# Patient Record
Sex: Female | Born: 1990 | Race: Black or African American | Hispanic: No | Marital: Single | Smoking: Never smoker
Health system: Southern US, Community
[De-identification: ages and names within clinical notes are randomized; demographics above are authoritative.]

## PROBLEM LIST (undated history)

## (undated) DIAGNOSIS — A749 Chlamydial infection, unspecified: Secondary | ICD-10-CM

## (undated) DIAGNOSIS — R896 Abnormal cytological findings in specimens from other organs, systems and tissues: Secondary | ICD-10-CM

## (undated) DIAGNOSIS — E0590 Thyrotoxicosis, unspecified without thyrotoxic crisis or storm: Secondary | ICD-10-CM

## (undated) DIAGNOSIS — IMO0002 Reserved for concepts with insufficient information to code with codable children: Secondary | ICD-10-CM

## (undated) DIAGNOSIS — E0500 Thyrotoxicosis with diffuse goiter without thyrotoxic crisis or storm: Secondary | ICD-10-CM

## (undated) HISTORY — DX: Chlamydial infection, unspecified: A74.9

## (undated) HISTORY — DX: Thyrotoxicosis with diffuse goiter without thyrotoxic crisis or storm: E05.00

## (undated) HISTORY — DX: Thyrotoxicosis, unspecified without thyrotoxic crisis or storm: E05.90

## (undated) HISTORY — DX: Reserved for concepts with insufficient information to code with codable children: IMO0002

## (undated) HISTORY — PX: NO PAST SURGERIES: SHX2092

## (undated) HISTORY — DX: Abnormal cytological findings in specimens from other organs, systems and tissues: R89.6

---
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
## 2010-05-26 ENCOUNTER — Emergency Department (HOSPITAL_COMMUNITY)
Admission: EM | Admit: 2010-05-26 | Discharge: 2010-05-26 | Disposition: A | Discharge status: Home / Self Care | Payer: Self-pay | Attending: Emergency Medicine | Admitting: Emergency Medicine

## 2010-05-26 ENCOUNTER — Emergency Department (HOSPITAL_COMMUNITY): Payer: Self-pay

## 2010-05-26 DIAGNOSIS — R112 Nausea with vomiting, unspecified: Secondary | ICD-10-CM | POA: Insufficient documentation

## 2010-05-26 DIAGNOSIS — N390 Urinary tract infection, site not specified: Secondary | ICD-10-CM | POA: Insufficient documentation

## 2010-05-26 DIAGNOSIS — R059 Cough, unspecified: Secondary | ICD-10-CM | POA: Insufficient documentation

## 2010-05-26 DIAGNOSIS — R05 Cough: Secondary | ICD-10-CM | POA: Insufficient documentation

## 2010-05-26 DIAGNOSIS — R61 Generalized hyperhidrosis: Secondary | ICD-10-CM | POA: Insufficient documentation

## 2010-05-26 DIAGNOSIS — R6883 Chills (without fever): Secondary | ICD-10-CM | POA: Insufficient documentation

## 2010-05-26 DIAGNOSIS — R000 Tachycardia, unspecified: Secondary | ICD-10-CM | POA: Insufficient documentation

## 2010-05-26 DIAGNOSIS — R0609 Other forms of dyspnea: Secondary | ICD-10-CM | POA: Insufficient documentation

## 2010-05-26 DIAGNOSIS — R0989 Other specified symptoms and signs involving the circulatory and respiratory systems: Secondary | ICD-10-CM | POA: Insufficient documentation

## 2010-05-26 DIAGNOSIS — E049 Nontoxic goiter, unspecified: Secondary | ICD-10-CM | POA: Insufficient documentation

## 2010-05-26 LAB — POCT I-STAT, CHEM 8
BUN: 13 mg/dL (ref 6–23)
Calcium, Ion: 1.27 mmol/L (ref 1.12–1.32)
Chloride: 105 mEq/L (ref 96–112)
Creatinine, Ser: 0.8 mg/dL (ref 0.4–1.2)
Glucose, Bld: 100 mg/dL — ABNORMAL HIGH (ref 70–99)
HCT: 39 % (ref 36.0–46.0)
Hemoglobin: 13.3 g/dL (ref 12.0–15.0)
Potassium: 4.4 mEq/L (ref 3.5–5.1)
Sodium: 139 mEq/L (ref 135–145)
TCO2: 25 mmol/L (ref 0–100)

## 2010-05-26 LAB — URINALYSIS, ROUTINE W REFLEX MICROSCOPIC
Glucose, UA: NEGATIVE mg/dL
Ketones, ur: 15 mg/dL — AB
Nitrite: POSITIVE — AB
Protein, ur: >300 mg/dL — AB
Specific Gravity, Urine: 1.029 (ref 1.005–1.030)
Urobilinogen, UA: 0.2 mg/dL (ref 0.0–1.0)
pH: 6 (ref 5.0–8.0)

## 2010-05-26 LAB — URINE MICROSCOPIC-ADD ON

## 2010-05-26 LAB — POCT PREGNANCY, URINE: Preg Test, Ur: NEGATIVE

## 2010-06-27 DIAGNOSIS — E0500 Thyrotoxicosis with diffuse goiter without thyrotoxic crisis or storm: Secondary | ICD-10-CM

## 2010-06-27 HISTORY — DX: Thyrotoxicosis with diffuse goiter without thyrotoxic crisis or storm: E05.00

## 2010-07-11 ENCOUNTER — Other Ambulatory Visit (HOSPITAL_COMMUNITY): Payer: Self-pay | Admitting: Nephrology

## 2010-07-11 DIAGNOSIS — E0590 Thyrotoxicosis, unspecified without thyrotoxic crisis or storm: Secondary | ICD-10-CM

## 2010-07-27 ENCOUNTER — Encounter (HOSPITAL_COMMUNITY)
Admission: RE | Admit: 2010-07-27 | Discharge: 2010-07-27 | Disposition: A | Discharge status: Home / Self Care | Payer: Self-pay | Source: Ambulatory Visit | Attending: Nephrology | Admitting: Nephrology

## 2010-07-27 DIAGNOSIS — E0590 Thyrotoxicosis, unspecified without thyrotoxic crisis or storm: Secondary | ICD-10-CM | POA: Insufficient documentation

## 2010-07-28 ENCOUNTER — Encounter (HOSPITAL_COMMUNITY)
Admission: RE | Admit: 2010-07-28 | Discharge: 2010-07-28 | Disposition: A | Discharge status: Home / Self Care | Payer: Self-pay | Source: Ambulatory Visit | Attending: Nephrology | Admitting: Nephrology

## 2010-07-28 MED ORDER — SODIUM PERTECHNETATE TC 99M INJECTION
10.4000 | Freq: Once | INTRAVENOUS | Status: AC | PRN
  Administered 2010-07-28: 10 via INTRAVENOUS

## 2010-07-28 MED ORDER — SODIUM IODIDE I 131 CAPSULE
8.0000 | Freq: Once | INTRAVENOUS | Status: AC | PRN
  Administered 2010-07-28: 8 via ORAL

## 2010-11-18 ENCOUNTER — Emergency Department (HOSPITAL_COMMUNITY): Payer: No Typology Code available for payment source

## 2010-11-18 ENCOUNTER — Emergency Department (HOSPITAL_COMMUNITY)
Admission: EM | Admit: 2010-11-18 | Discharge: 2010-11-18 | Disposition: A | Discharge status: Home / Self Care | Payer: No Typology Code available for payment source | Attending: Emergency Medicine | Admitting: Emergency Medicine

## 2010-11-18 DIAGNOSIS — M549 Dorsalgia, unspecified: Secondary | ICD-10-CM | POA: Insufficient documentation

## 2010-11-18 DIAGNOSIS — Z79899 Other long term (current) drug therapy: Secondary | ICD-10-CM | POA: Insufficient documentation

## 2010-11-18 DIAGNOSIS — N390 Urinary tract infection, site not specified: Secondary | ICD-10-CM | POA: Insufficient documentation

## 2010-11-18 LAB — URINALYSIS, ROUTINE W REFLEX MICROSCOPIC
Nitrite: POSITIVE — AB
Protein, ur: NEGATIVE mg/dL
Urobilinogen, UA: 0.2 mg/dL (ref 0.0–1.0)

## 2010-11-18 LAB — URINE MICROSCOPIC-ADD ON

## 2011-02-05 ENCOUNTER — Other Ambulatory Visit (HOSPITAL_COMMUNITY)
Admission: RE | Admit: 2011-02-05 | Discharge: 2011-02-05 | Disposition: A | Discharge status: Home / Self Care | Payer: BC Managed Care – PPO | Source: Ambulatory Visit | Attending: Family Medicine | Admitting: Family Medicine

## 2011-02-05 ENCOUNTER — Encounter: Payer: Self-pay | Admitting: Family Medicine

## 2011-02-05 ENCOUNTER — Ambulatory Visit (INDEPENDENT_AMBULATORY_CARE_PROVIDER_SITE_OTHER): Payer: No Typology Code available for payment source | Admitting: Family Medicine

## 2011-02-05 DIAGNOSIS — N760 Acute vaginitis: Secondary | ICD-10-CM

## 2011-02-05 DIAGNOSIS — IMO0001 Reserved for inherently not codable concepts without codable children: Secondary | ICD-10-CM

## 2011-02-05 DIAGNOSIS — Z1159 Encounter for screening for other viral diseases: Secondary | ICD-10-CM | POA: Insufficient documentation

## 2011-02-05 DIAGNOSIS — Z113 Encounter for screening for infections with a predominantly sexual mode of transmission: Secondary | ICD-10-CM | POA: Insufficient documentation

## 2011-02-05 DIAGNOSIS — Z8349 Family history of other endocrine, nutritional and metabolic diseases: Secondary | ICD-10-CM

## 2011-02-05 DIAGNOSIS — Z309 Encounter for contraceptive management, unspecified: Secondary | ICD-10-CM

## 2011-02-05 DIAGNOSIS — Z23 Encounter for immunization: Secondary | ICD-10-CM

## 2011-02-05 DIAGNOSIS — Z0000 Encounter for general adult medical examination without abnormal findings: Secondary | ICD-10-CM

## 2011-02-05 DIAGNOSIS — Z83438 Family history of other disorder of lipoprotein metabolism and other lipidemia: Secondary | ICD-10-CM

## 2011-02-05 DIAGNOSIS — Z01419 Encounter for gynecological examination (general) (routine) without abnormal findings: Secondary | ICD-10-CM | POA: Insufficient documentation

## 2011-02-05 DIAGNOSIS — E0500 Thyrotoxicosis with diffuse goiter without thyrotoxic crisis or storm: Secondary | ICD-10-CM | POA: Insufficient documentation

## 2011-02-05 LAB — LIPID PANEL
Cholesterol: 270 mg/dL — ABNORMAL HIGH (ref 0–200)
HDL: 60 mg/dL (ref >39)
Total CHOL/HDL Ratio: 4.5 Ratio

## 2011-02-05 LAB — POCT URINALYSIS DIPSTICK
Bilirubin, UA: NEGATIVE
Blood, UA: NEGATIVE
Glucose, UA: NEGATIVE
Nitrite, UA: NEGATIVE
Spec Grav, UA: 1.02

## 2011-02-05 MED ORDER — NORGESTIM-ETH ESTRAD TRIPHASIC 0.18/0.215/0.25 MG-35 MCG PO TABS: 1.0000 | ORAL_TABLET | Freq: Every day | ORAL | Status: DC

## 2011-02-05 NOTE — Patient Instructions (Signed)
HEALTH MAINTENANCE RECOMMENDATIONS:  It is recommended that you get at least 30 minutes of aerobic exercise at least 5 days/week (for weight loss, you may need as much as 60-90 minutes). This can be any activity that gets your heart rate up. This can be divided in 10-15 minute intervals if needed, but try and build up your endurance at least once a week.  Weight bearing exercise is also recommended twice weekly.  Eat a healthy diet with lots of vegetables, fruits and fiber.  "Colorful" foods have a lot of vitamins (ie green vegetables, tomatoes, red peppers, etc).  Limit sweet tea, regular sodas and alcoholic beverages, all of which has a lot of calories and sugar.  Up to 1 alcoholic drink daily may be beneficial for women (unless trying to lose weight, watch sugars).  Drink a lot of water.  Calcium recommendations are 1200-1500 mg daily (1500 mg for postmenopausal women or women without ovaries), and vitamin D 1000 IU daily.  This should be obtained from diet and/or supplements (vitamins), and calcium should not be taken all at once, but in divided doses.  Monthly self breast exams and yearly mammograms for women over the age of 40 is recommended.  Sunscreen of at least SPF 30 should be used on all sun-exposed parts of the skin when outside between the hours of 10 am and 4 pm (not just when at beach or pool, but even with exercise, golf, tennis, and yard work!)  Use a sunscreen that says "broad spectrum" so it covers both UVA and UVB rays, and make sure to reapply every 1-2 hours.  Remember to change the batteries in your smoke detectors when changing your clock times in the spring and fall.  Use your seat belt every time you are in a car, and please drive safely and not be distracted with cell phones and texting while driving.  Please call to schedule a routine dental cleaning--you should do this twice a year (at least yearly)  Remember to use back up contraception (ie condoms) anytime you take  antibiotics with birth control pills.  Call [place] if you are having a lot of problems with headaches/migraines or nausea from the birth control pills (to have dose changed)  Remember that you are NOT protected from pregnancy the first month of birth control pills.  I recommend that you ALWAYS use condoms, to prevent sexually transmitted diseases

## 2011-02-05 NOTE — Progress Notes (Signed)
Katie Bowers is a 20 y.o. female who presents for a complete physical.  She has the following concerns:  She has a history of abnormal paps and chlamydia.  She had LGSIL in 06/2009 (HPV testing not done).  She had ASCUS 12/2009 with high risk HPV.  This was shortly before [employer] closed, and she never heard anything back regarding follow up for pap smear.  She was treated for Chlamydia both of these times as well (May and November 2011).  Would like to be tested today.  She is having some vaginal discharge and odor, no itching.  She uses condoms regularly, but is interested in changing to birth control pills.  She previously took Depo Provera x 2 years, but didn't feel right not having periods.  Never took pills in the past. No family history of complications related to hormones/pregnancy, no history of migraines.  Immunization History  Administered Date(s) Administered  . Influenza Split 02/05/2011  last tetanus was in 6th grade Last Pap smear: 12/2009 Last mammogram: never Last colonoscopy: never Last DEXA: never Dentist: more than 2 years ago Ophtho: never Exercise:  Minimal  Past Medical History  Diagnosis Date  . 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 Chlamydia 06/2009, 12/2009  . ASCUS (atypical squamous cells of undetermined significance) on Pap smear 12/2009    with high grade HPV  . Grave's disease 06/2010    sees Dr. Margaretmary Bayley    History reviewed. No pertinent past surgical history.  History   Social History  . Marital Status: Single    Spouse Name: N/A    Number of Children: 0  . Years of Education: N/A   Occupational History  . Not on file.   Social History Main Topics  . Smoking status: Never Smoker   . Smokeless tobacco: Never Used  . Alcohol Use: Yes     2 drinks every other month.  . Drug Use: No  . Sexually Active: Yes -- Female partner(s)    Birth Control/ Protection: Condom   Other Topics Concern  . Not on file    Social History Narrative   Lives with her mother, no pets.  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 Hyperlipidemia Mother   . Hypothyroidism Mother   . Cancer Maternal Uncle     lung cancer (smoker)  . Hyperthyroidism Paternal Aunt   . Hypertension Maternal Grandmother   . Hypothyroidism Paternal Grandmother     Current outpatient prescriptions:Biotin 5000 MCG CAPS, Take 2 capsules by mouth daily.  , Disp: , Rfl: ;  methimazole (TAPAZOLE) 10 MG tablet, Take 10 mg by mouth daily.  , Disp: , Rfl: ;  Norgestimate-Ethinyl Estradiol Triphasic (ORTHO TRI-CYCLEN, 28,) 0.18/0.215/0.25 MG-35 MCG tablet, Take 1 tablet by mouth daily., Disp: 1 Package, Rfl: 11  No Known Allergies  ROS: The patient denies anorexia, fever, recent weight changes (since on thyroid meds), headaches,  vision changes, decreased hearing, ear pain, sore throat, breast concerns, chest pain, palpitations, dizziness, syncope, dyspnea on exertion, cough, swelling, nausea, vomiting, diarrhea, constipation, abdominal pain, melena, hematochezia, indigestion/heartburn, hematuria, incontinence, dysuria, irregular menstrual cycles, genital lesions, joint pains, numbness, tingling, weakness, tremor, suspicious skin lesions, depression, anxiety, abnormal bleeding/bruising, or enlarged lymph nodes.  BP 92/60  Pulse 72  Ht 5\' 4"  (1.626 m)  Wt 106 lb (48.081 kg)  BMI 18.19 kg/m2  LMP 01/30/2011  General  Appearance:    Alert, cooperative, no distress, appears stated age, thin   Head:    Normocephalic, without obvious abnormality, atraumatic  Eyes:    PERRL, conjunctiva/corneas clear, EOM's intact, fundi    benign  Ears:    Normal TM's and external ear canals  Nose:   Nares normal, mucosa normal, no drainage or sinus   tenderness  Throat:   Lips, mucosa, and tongue normal; teeth and gums normal  Neck:   Supple, no lymphadenopathy;  thyroid: moderately  enlarged, smooth without nodules, nontender; no carotid   bruit or JVD  Back:    Spine nontender, no curvature, ROM normal, no CVA     tenderness  Lungs:     Clear to auscultation bilaterally without wheezes, rales or     ronchi; respirations unlabored  Chest Wall:    No tenderness or deformity   Heart:    Regular rate and rhythm, S1 and S2 normal, no murmur, rub   or gallop  Breast Exam:    No tenderness, masses, or nipple discharge or inversion.      No axillary lymphadenopathy  Abdomen:     Soft, non-tender, nondistended, normoactive bowel sounds,    no masses, no hepatosplenomegaly  Genitalia:    Normal external genitalia without lesions.  BUS and vagina normal; no cervical motion tenderness. Uterus and adnexa not enlarged, nontender, no masses.  Pap performed. Piercing of clitoral hood.  Thick whitish yellow discharge.  Slight pinpoint punctations at 9 and 7 o'clock on cervix  Rectal:    Not performed due to age<40 and no related complaints  Extremities:   No clubbing, cyanosis or edema  Pulses:   2+ and symmetric all extremities  Skin:   Skin color, texture, turgor normal, no rashes or lesions. Multiple tattoos, umbilical piercing  Lymph nodes:   Cervical, supraclavicular, and axillary nodes normal  Neurologic:   CNII-XII intact, normal strength, sensation and gait; reflexes 2+ and symmetric throughout          Psych:   Normal mood, affect, hygiene and grooming.     ASSESSMENT/PLAN: 1. Routine general medical examination at a health care facility  POCT Urinalysis Dipstick, Visual acuity screening, POCT urine pregnancy, Cytology - PAP, HIV Antibody ( Reflex)  2. Need for prophylactic vaccination and inoculation against influenza  Flu vaccine greater than or equal to 3yo preservative free IM  3. Contraception  POCT urine pregnancy  4. Need for Tdap vaccination  Tdap vaccine greater than or equal to 7yo IM  5. Need for HPV vaccination  HPV vaccine quadravalent 3 dose IM  6. Vaginitis   Cytology - PAP  7. Family history of hyperlipidemia  Lipid panel  8. General counseling and advice for contraceptive management  Norgestimate-Ethinyl Estradiol Triphasic (ORTHO TRI-CYCLEN, 28,) 0.18/0.215/0.25 MG-35 MCG tablet   Contraceptive management: risks of OCP's reviewed at length, and how to take properly. Start OTC H/o abnormal paps--await results from pap.  May need referral to GYN if ongoing abnormal paps Vaginal discharge--will check GC, chlamydia, as well as tests for BV, trich and yeast from her pap specimen.  Safe sex precautions reviewed.  Discussed monthly self breast exams and yearly mammograms after the age of 51; at least 30 minutes of aerobic activity at least 5 days/week; proper sunscreen use reviewed; healthy diet, including goals of calcium and vitamin D intake and alcohol recommendations (less than or equal to 1 drink/day) reviewed; regular seatbelt use; changing batteries in smoke detectors.  Immunization recommendations discussed--Tdap,  flu and Gardisil #1 given today.  Colonoscopy recommendations reviewed--age 30  F/u in 2 and 6 months for Gardisil #2 and 3 Check lipids today, and HIV. Family h/o hyperlipidemia.  Pt isn't fasting today--if elevated, will need to return for fasting labs

## 2011-02-06 LAB — HIV ANTIBODY (ROUTINE TESTING W REFLEX): HIV: NONREACTIVE

## 2011-02-12 ENCOUNTER — Encounter: Payer: Self-pay | Admitting: Family Medicine

## 2011-02-12 DIAGNOSIS — E7800 Pure hypercholesterolemia, unspecified: Secondary | ICD-10-CM | POA: Insufficient documentation

## 2011-02-14 ENCOUNTER — Other Ambulatory Visit: Payer: Self-pay | Admitting: *Deleted

## 2011-02-14 DIAGNOSIS — E7800 Pure hypercholesterolemia, unspecified: Secondary | ICD-10-CM

## 2011-02-14 DIAGNOSIS — N760 Acute vaginitis: Secondary | ICD-10-CM

## 2011-02-14 DIAGNOSIS — B9689 Other specified bacterial agents as the cause of diseases classified elsewhere: Secondary | ICD-10-CM

## 2011-02-14 MED ORDER — METRONIDAZOLE 500 MG PO TABS: 500.0000 mg | ORAL_TABLET | Freq: Two times a day (BID) | ORAL | Status: DC

## 2011-04-03 ENCOUNTER — Encounter: Payer: Self-pay | Admitting: Internal Medicine

## 2011-04-09 ENCOUNTER — Other Ambulatory Visit: Payer: BC Managed Care – PPO

## 2011-06-26 ENCOUNTER — Encounter: Payer: Self-pay | Admitting: Medical

## 2011-06-26 ENCOUNTER — Ambulatory Visit (INDEPENDENT_AMBULATORY_CARE_PROVIDER_SITE_OTHER): Payer: No Typology Code available for payment source | Admitting: Medical

## 2011-06-26 VITALS — BP 100/70 | Temp 98.2°F | Ht 63.0 in | Wt 112.0 lb

## 2011-06-26 DIAGNOSIS — Z202 Contact with and (suspected) exposure to infections with a predominantly sexual mode of transmission: Secondary | ICD-10-CM

## 2011-06-26 DIAGNOSIS — Z309 Encounter for contraceptive management, unspecified: Secondary | ICD-10-CM

## 2011-06-26 DIAGNOSIS — Z23 Encounter for immunization: Secondary | ICD-10-CM

## 2011-06-26 MED ORDER — CEFTRIAXONE SODIUM 1 G IJ SOLR
250.0000 mg | Freq: Once | INTRAMUSCULAR | Status: AC
  Administered 2011-06-26: 250 mg via INTRAMUSCULAR

## 2011-06-26 MED ORDER — AZITHROMYCIN 1 G PO PACK: 1.0000 | PACK | Freq: Once | ORAL | Status: AC

## 2011-06-26 MED ORDER — MEDROXYPROGESTERONE ACETATE 150 MG/ML IM SUSP
150.0000 mg | Freq: Once | INTRAMUSCULAR | Status: AC
  Administered 2011-06-26: 150 mg via INTRAMUSCULAR

## 2011-06-26 NOTE — Progress Notes (Signed)
Subjective: Here for multiple c/o.    1) wants to restart Depo Provera, has been on this prior 79mo ago.  Wants contraception.  LMP 06/23/11.   2) wants 2nd HPV vaccine, had the first already but past due.  3) she has one sexual partner, doesn't use condoms, but she received notice from health dept that he was + for nongonococcal urethritis, and she was advised to come to her doctor for treatment.  She notes mild white discharge.  She has hx/o chlamydia in the past.   Hx/o BV as well on last pap smear.   Past Medical History  Diagnosis Date  . 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 Chlamydia 06/2009, 12/2009  . ASCUS (atypical squamous cells of undetermined significance) on Pap smear 12/2009    with high grade HPV  . Grave's disease 06/2010    sees Dr. Margaretmary Bayley   ROS as noted in HPI  Objective:   Objective:   Physical Exam  Filed Vitals:   06/26/11 1100  BP: 100/70  Temp: 98.2 F (36.8 C)    General appearance: alert, no distress, WD/WN  Neck: supple, no lymphadenopathy, no thyromegaly, no masses Heart: RRR, normal S1, S2, no murmurs Lungs: CTA bilaterally, no wheezes, rhonchi, or rales Abdomen: +bs, soft, non tender, non distended, no masses, no hepatomegaly, no splenomegaly  Assessment and Plan :    Encounter Diagnoses  Name Primary?  . Venereal disease contact Yes  . Need for HPV vaccination   . Contraception management    STD contact - treated today with Rocephin 250mg  IM, script for Azithromycin 1g powder, discussed safe sex, no sex until retested, and discussed risks of STDs.  RTC 2-3 wk for recheck.  HPV vaccine counseling given, and HPV #2 given  Contraception Management - discussed risks/ benefits, urine pregnancy negative today, Depo Provera restarted today, RTC 45mo for next injection.

## 2011-08-06 ENCOUNTER — Other Ambulatory Visit: Payer: BC Managed Care – PPO

## 2011-08-15 ENCOUNTER — Other Ambulatory Visit (INDEPENDENT_AMBULATORY_CARE_PROVIDER_SITE_OTHER): Payer: No Typology Code available for payment source

## 2011-08-15 DIAGNOSIS — E7800 Pure hypercholesterolemia, unspecified: Secondary | ICD-10-CM

## 2011-08-15 DIAGNOSIS — Z23 Encounter for immunization: Secondary | ICD-10-CM

## 2011-08-15 LAB — LIPID PANEL
Cholesterol: 220 mg/dL — ABNORMAL HIGH (ref 0–200)
Total CHOL/HDL Ratio: 3.9 Ratio
VLDL: 16 mg/dL (ref 0–40)

## 2011-09-24 ENCOUNTER — Encounter: Payer: Self-pay | Admitting: Medical

## 2011-09-24 ENCOUNTER — Ambulatory Visit (INDEPENDENT_AMBULATORY_CARE_PROVIDER_SITE_OTHER): Payer: No Typology Code available for payment source | Admitting: Medical

## 2011-09-24 VITALS — BP 100/70 | HR 60 | Temp 98.2°F | Resp 16 | Wt 113.0 lb

## 2011-09-24 DIAGNOSIS — Z113 Encounter for screening for infections with a predominantly sexual mode of transmission: Secondary | ICD-10-CM

## 2011-09-24 DIAGNOSIS — Z2089 Contact with and (suspected) exposure to other communicable diseases: Secondary | ICD-10-CM

## 2011-09-24 DIAGNOSIS — Z309 Encounter for contraceptive management, unspecified: Secondary | ICD-10-CM

## 2011-09-24 DIAGNOSIS — N949 Unspecified condition associated with female genital organs and menstrual cycle: Secondary | ICD-10-CM

## 2011-09-24 DIAGNOSIS — Z202 Contact with and (suspected) exposure to infections with a predominantly sexual mode of transmission: Secondary | ICD-10-CM

## 2011-09-24 DIAGNOSIS — N898 Other specified noninflammatory disorders of vagina: Secondary | ICD-10-CM

## 2011-09-24 MED ORDER — FLUCONAZOLE 150 MG PO TABS: ORAL_TABLET | ORAL | Status: DC

## 2011-09-24 MED ORDER — MEDROXYPROGESTERONE ACETATE 150 MG/ML IM SUSP
150.0000 mg | Freq: Once | INTRAMUSCULAR | Status: AC
  Administered 2011-09-24: 150 mg via INTRAMUSCULAR

## 2011-09-24 NOTE — Progress Notes (Addendum)
Subjective: Here for recheck.   I saw her few months ago in April for exposure of nongonococcal urethritis, as her prior sexual partner was + from health dept visit.   She notes no new sexual partners since last visit, no sexual partners since last visit.  She has had some vaginal odor.  She does douche.  No other concerns.  Here for depo provera shot today.  This was started last visit.   Objective: Gen: wd, wn, nad GU: normal external genitalia, clitoris piercing present, white creamy vaginal discharge, no cervical changes, swabs taken, exam chaperoned by nurse  Wet prep with epithelials, +yeasts on KOH prep,  But no BV, trich  Assessment: Encounter Diagnoses  Name Primary?  . Venereal disease contact Yes  . Screen for STD (sexually transmitted disease)   . Vaginal odor   . Contraception management    Plan: STD screening today, discussed safe sex, condom use.  Will treat for yeast infection, Diflucan sent.    Depo Provera given today.  Return 12/16/11 for repeat.

## 2011-09-25 ENCOUNTER — Other Ambulatory Visit: Payer: Self-pay | Admitting: Medical

## 2011-09-25 LAB — GC/CHLAMYDIA PROBE AMP, GENITAL: GC Probe Amp, Genital: NEGATIVE

## 2011-09-25 MED ORDER — DOXYCYCLINE HYCLATE 100 MG PO TABS: 100.0000 mg | ORAL_TABLET | Freq: Two times a day (BID) | ORAL | Status: AC

## 2011-12-12 ENCOUNTER — Other Ambulatory Visit (INDEPENDENT_AMBULATORY_CARE_PROVIDER_SITE_OTHER): Payer: No Typology Code available for payment source

## 2011-12-12 DIAGNOSIS — IMO0001 Reserved for inherently not codable concepts without codable children: Secondary | ICD-10-CM

## 2011-12-12 DIAGNOSIS — Z309 Encounter for contraceptive management, unspecified: Secondary | ICD-10-CM

## 2011-12-12 MED ORDER — MEDROXYPROGESTERONE ACETATE 150 MG/ML IM SUSP
150.0000 mg | Freq: Once | INTRAMUSCULAR | Status: AC
  Administered 2011-12-12: 150 mg via INTRAMUSCULAR

## 2012-02-11 ENCOUNTER — Encounter: Payer: No Typology Code available for payment source | Admitting: Family Medicine

## 2012-04-01 ENCOUNTER — Encounter (HOSPITAL_COMMUNITY): Payer: Self-pay | Admitting: Emergency Medicine

## 2012-04-01 ENCOUNTER — Emergency Department (HOSPITAL_COMMUNITY)
Admission: EM | Admit: 2012-04-01 | Discharge: 2012-04-01 | Disposition: A | Discharge status: Home / Self Care | Payer: No Typology Code available for payment source | Attending: Emergency Medicine | Admitting: Emergency Medicine

## 2012-04-01 DIAGNOSIS — Z79899 Other long term (current) drug therapy: Secondary | ICD-10-CM | POA: Insufficient documentation

## 2012-04-01 DIAGNOSIS — E0500 Thyrotoxicosis with diffuse goiter without thyrotoxic crisis or storm: Secondary | ICD-10-CM | POA: Insufficient documentation

## 2012-04-01 DIAGNOSIS — H6690 Otitis media, unspecified, unspecified ear: Secondary | ICD-10-CM

## 2012-04-01 DIAGNOSIS — M79609 Pain in unspecified limb: Secondary | ICD-10-CM | POA: Insufficient documentation

## 2012-04-01 DIAGNOSIS — Z8619 Personal history of other infectious and parasitic diseases: Secondary | ICD-10-CM | POA: Insufficient documentation

## 2012-04-01 DIAGNOSIS — M79606 Pain in leg, unspecified: Secondary | ICD-10-CM

## 2012-04-01 DIAGNOSIS — R51 Headache: Secondary | ICD-10-CM | POA: Insufficient documentation

## 2012-04-01 LAB — CBC
MCH: 29.7 pg (ref 26.0–34.0)
MCV: 87.9 fL (ref 78.0–100.0)
Platelets: 297 10*3/uL (ref 150–400)
RBC: 4.48 MIL/uL (ref 3.87–5.11)
RDW: 12.2 % (ref 11.5–15.5)

## 2012-04-01 LAB — BASIC METABOLIC PANEL
Calcium: 9.7 mg/dL (ref 8.4–10.5)
Creatinine, Ser: 0.84 mg/dL (ref 0.50–1.10)
GFR calc Af Amer: >90 mL/min (ref >90)
GFR calc non Af Amer: >90 mL/min (ref >90)
Sodium: 133 mEq/L — ABNORMAL LOW (ref 135–145)

## 2012-04-01 MED ORDER — METOCLOPRAMIDE HCL 5 MG/ML IJ SOLN
10.0000 mg | Freq: Once | INTRAMUSCULAR | Status: AC
  Administered 2012-04-01: 10 mg via INTRAVENOUS
  Filled 2012-04-01: qty 2

## 2012-04-01 MED ORDER — AMOXICILLIN 500 MG PO CAPS
500.0000 mg | ORAL_CAPSULE | Freq: Once | ORAL | Status: AC
  Administered 2012-04-01: 500 mg via ORAL
  Filled 2012-04-01: qty 1

## 2012-04-01 MED ORDER — NAPROXEN 500 MG PO TABS: 500.0000 mg | ORAL_TABLET | Freq: Two times a day (BID) | ORAL | Status: DC

## 2012-04-01 MED ORDER — KETOROLAC TROMETHAMINE 30 MG/ML IJ SOLN
30.0000 mg | Freq: Once | INTRAMUSCULAR | Status: AC
  Administered 2012-04-01: 30 mg via INTRAVENOUS
  Filled 2012-04-01: qty 1

## 2012-04-01 MED ORDER — METOCLOPRAMIDE HCL 10 MG PO TABS: 10.0000 mg | ORAL_TABLET | Freq: Three times a day (TID) | ORAL | Status: DC | PRN

## 2012-04-01 MED ORDER — AMOXICILLIN 500 MG PO CAPS: 500.0000 mg | ORAL_CAPSULE | Freq: Three times a day (TID) | ORAL | Status: DC

## 2012-04-01 MED ORDER — SODIUM CHLORIDE 0.9 % IV BOLUS (SEPSIS)
1000.0000 mL | Freq: Once | INTRAVENOUS | Status: AC
  Administered 2012-04-01: 1000 mL via INTRAVENOUS

## 2012-04-01 NOTE — ED Notes (Signed)
Pt ambulated to bathroom. Pt denies HA, or pain in her legs.

## 2012-04-01 NOTE — ED Notes (Signed)
Pt states that she ate dinner and then fell asleep. Pt states that she fell alseep on her right side and woke up with her right arm numb and hurting (pt no longer having same sensation in right arm.) pt states that she also woke up feeling "swimmy headed." pt states that she no longer feels dizzy, but has a HA (no hx of migraines or severe HA's) pt states that she has now been having bilateral leg pain that feels like sharp shooting pain that travels from her feet up her legs. Pt ambulatory. Pt alert and oriented, able to follow commands and move extremities. Pt denies N/V.

## 2012-04-01 NOTE — ED Notes (Signed)
Patient complaining of dizziness, lightheadedness tingling/shooting pains in both legs that began around 1900 last night.  Patient reports that while she was sleeping on her right side tonight, her right side went numb.  Denies numbness at this time.  Denies nausea, vomiting, chest pain, and shortness of breath.  Patient alert and oriented x4; PERRL present.  No facial droop present; smile symmetrical.  Hand grips and foot pushes are bilaterally equal and strong.  Also reporting headache.

## 2012-04-01 NOTE — ED Provider Notes (Signed)
History     CSN: [id number]  Arrival date & time 04/01/12  0021   First MD Initiated Contact with Patient 04/01/12 0138      Chief Complaint  Patient presents with  . Dizziness    (Consider location/radiation/quality/duration/timing/severity/associated sxs/prior treatment) HPI Comments: 22 y/o female with hx of Graves' disease who presents with a complaint of bilateral lower extremity pain it is a sharp and shooting pain and is intermittent. Nothing seems to make it better or worse, it started several hours prior to arrival.  When she awoke from her sleep this evening she had right upper extremity numbness and tingling which has since resolved.  She states it only lasted for a couple of minutes. The symptoms in her legs continue, she has no swelling, no injuries and is able to walk without any difficulty.  The patient also complains of left-sided neck pain underneath her jaw, a bilateral temporal headache but no fevers chills nausea vomiting diarrhea cough shortness of breath chest pain back pain dysuria diarrhea rashes swelling.  She denies sinus pressure or drainage, has no focal weakness, vertigo, visual changes, near syncope, changes in hearing, or problems with balance.  The history is provided by the patient.    Past Medical History  Diagnosis Date  . 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 Chlamydia 06/2009, 12/2009  . ASCUS (atypical squamous cells of undetermined significance) on Pap smear 12/2009    with high grade HPV  . Grave's disease 06/2010    sees Dr. Margaretmary Bayley    History reviewed. No pertinent past surgical history.  Family History  Problem Relation Age of Onset  . Hyperlipidemia Mother   . Hypothyroidism Mother   . Cancer Maternal Uncle     lung cancer (smoker)  . Hyperthyroidism Paternal Aunt   . Hypertension Maternal Grandmother   . Hypothyroidism Paternal Grandmother     History  Substance Use Topics  . Smoking status: Never  Smoker   . Smokeless tobacco: Never Used  . Alcohol Use: Yes     Comment: 2 drinks every other month.    OB History    Grav Para Term Preterm Abortions TAB SAB Ect Mult Living                  Review of Systems  All other systems reviewed and are negative.    Allergies  Review of patient's allergies indicates no known allergies.  Home Medications   Current Outpatient Rx  Name  Route  Sig  Dispense  Refill  . MEDROXYPROGESTERONE ACETATE 150 MG/ML IM SUSP   Intramuscular   Inject 150 mg into the muscle every 3 (three) months.         . METHIMAZOLE 10 MG PO TABS   Oral   Take 10 mg by mouth daily.           . AMOXICILLIN 500 MG PO CAPS   Oral   Take 1 capsule (500 mg total) by mouth 3 (three) times daily.   21 capsule   0   . METOCLOPRAMIDE HCL 10 MG PO TABS   Oral   Take 1 tablet (10 mg total) by mouth 3 (three) times daily as needed (headache / nausea).   20 tablet   0   . NAPROXEN 500 MG PO TABS   Oral   Take 1 tablet (500 mg total) by mouth 2 (two) times daily with a meal.   30 tablet   0  BP 110/69  Pulse 97  Temp 100 F (37.8 C) (Oral)  Resp 16  SpO2 100%  Physical Exam  Nursing note and vitals reviewed. Constitutional: She appears well-developed and well-nourished. No distress.  HENT:  Head: Normocephalic and atraumatic.  Mouth/Throat: Oropharynx is clear and moist. No oropharyngeal exudate.       Tympanic membrane on the left is opacified bulging and mildly erythematous. Oropharynx is moist but erythematous without asymmetry exudate or hypertrophy.  Eyes: Conjunctivae normal and EOM are normal. Pupils are equal, round, and reactive to light. Right eye exhibits no discharge. Left eye exhibits no discharge. No scleral icterus.  Neck: Normal range of motion. Neck supple. No JVD present. Thyromegaly present.       Left anterior cervical lymphadenopathy  Cardiovascular: Regular rhythm, normal heart sounds and intact distal pulses.  Exam  reveals no gallop and no friction rub.   No murmur heard.      Pulse of 105, jumps to 120 when she sits up  Pulmonary/Chest: Effort normal and breath sounds normal. No respiratory distress. She has no wheezes. She has no rales.  Abdominal: Soft. Bowel sounds are normal. She exhibits no distension and no mass. There is no tenderness.  Musculoskeletal: Normal range of motion. She exhibits no edema and no tenderness.  Lymphadenopathy:    She has cervical adenopathy.  Neurological: She is alert. Coordination normal.       The patient has normal speech, normal memory, normal coordination of all 4 extremities without any limb ataxia. The patient has normal strength of all 4 extremities at the major muscle groups including shoulders, biceps, triceps, forearm, grips, quadriceps, hamstrings, calves. Normal sensation to light touch and pinprick of all 4 extremities and the trunk. No truncal ataxia, normal gait, cranial nerves III through XII are intact.  Normal peripheral visual fields. Normal extraocular movements. Normal pupillary exam. No pronator drift, normal reflexes at the brachial radialis, biceps and patellar tendons. Normal position sense, normal temperature sensation to cold and warm  Skin: Skin is warm and dry. No rash noted. No erythema.  Psychiatric: She has a normal mood and affect. Her behavior is normal.       Tearful    ED Course  Procedures (including critical care time)  Labs Reviewed  BASIC METABOLIC PANEL - Abnormal; Notable for the following:    Sodium 133 (*)     Glucose, Bld 105 (*)     All other components within normal limits  CBC   No results found.   1. Headache   2. Leg pain   3. Otitis media       MDM  Though the patient appears tearful and anxious she states that she has no other reason to be that way, she denies any stress any depression and feels safe at home. She has a bitemporal headache which is throbbing in nature and seems benign. She does not have any  other next deafness or tenderness with range of motion but does have lymphadenopathy for left anterior cervical chain and signs of otitis media. She will be given antibiotics, fluids, medications for her headache. She has no other focal neurologic deficits, laboratory workup shows mild hyponatremia with a sodium of 133 but no other abnormalities.  Pt has improved significantly after getting medicines and complete resolution of symptoms.  Tachycardia totally resolved, pt states ready for d/c.        Vida Roller, MD 04/01/12 0530

## 2012-09-25 IMAGING — CR DG CHEST 2V
2 series · 2 of 2 positions shown · non-contrast
Comparison: None.

CLINICAL DATA: Right-sided chest pain, shortness of breath

CHEST - 2 VIEW

[w chest pa]
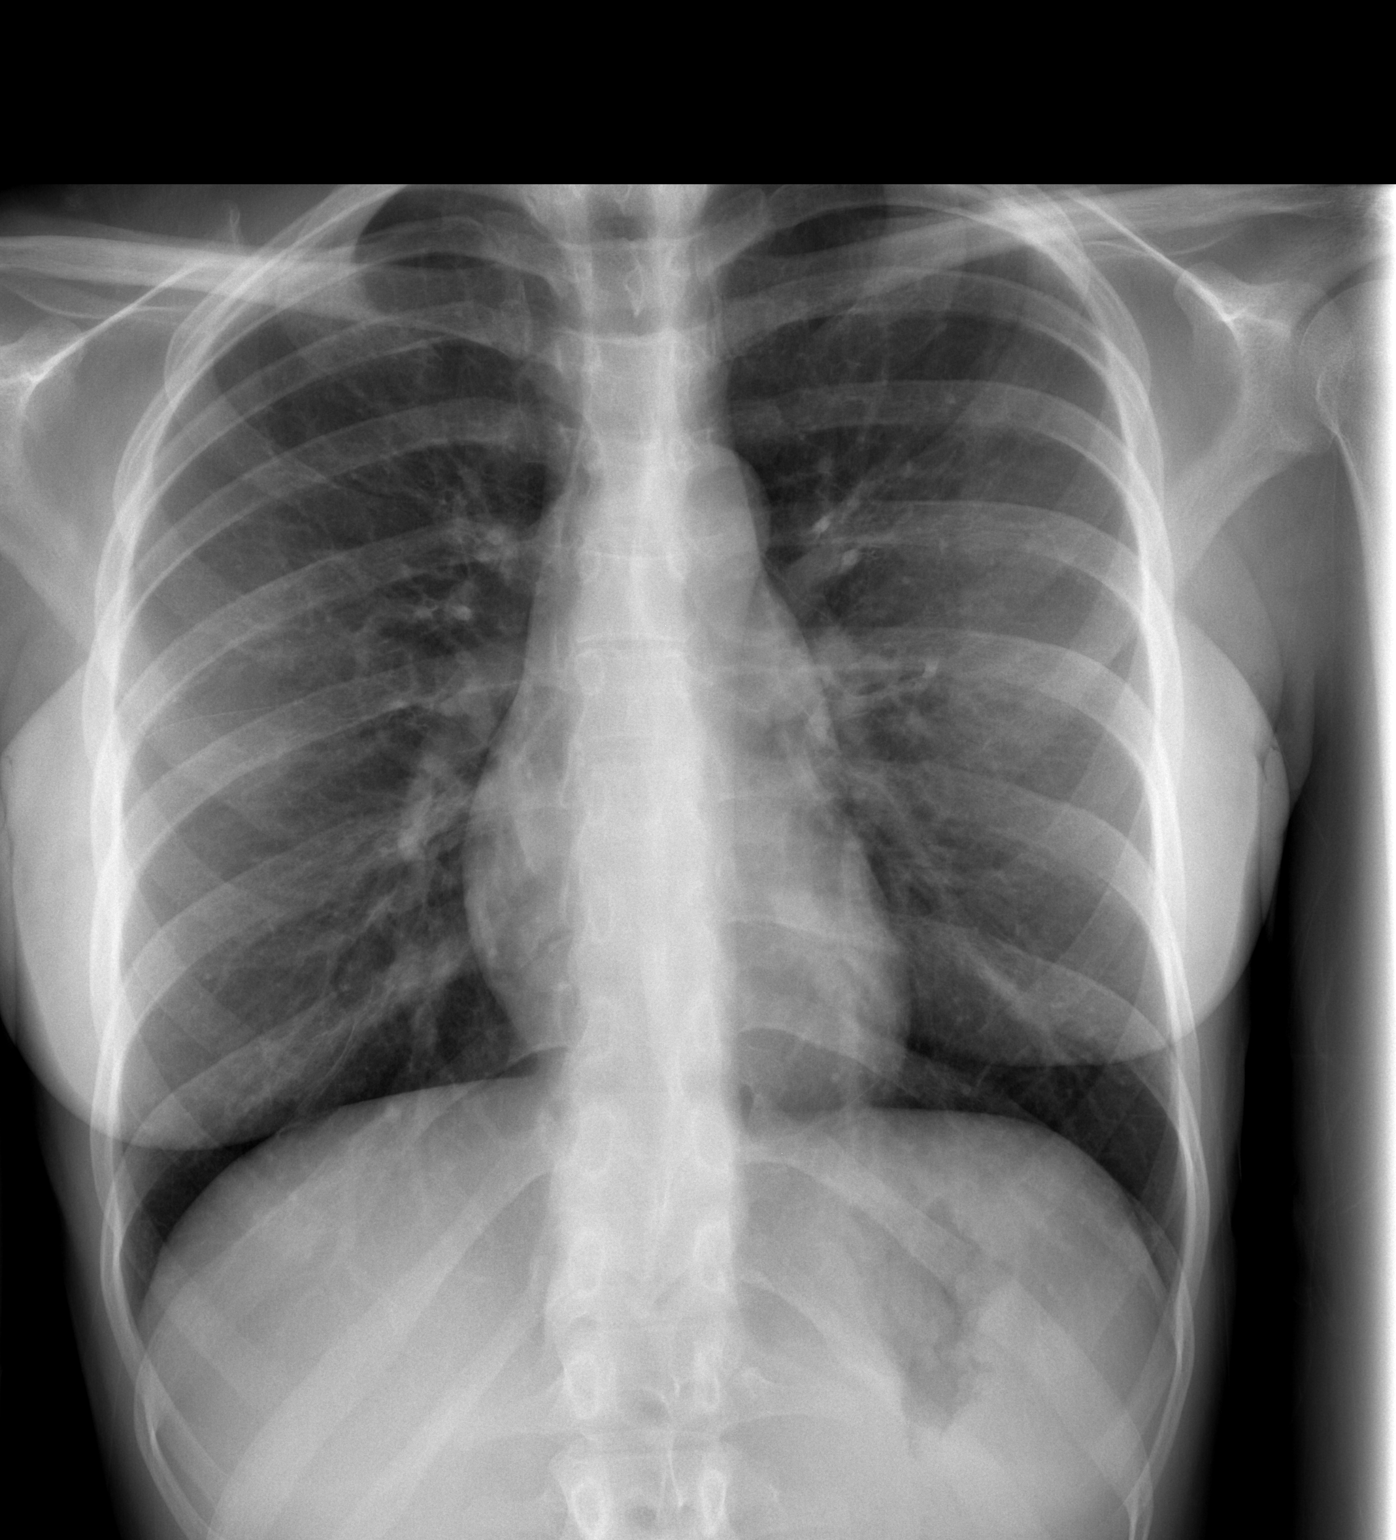

[w chest lat]
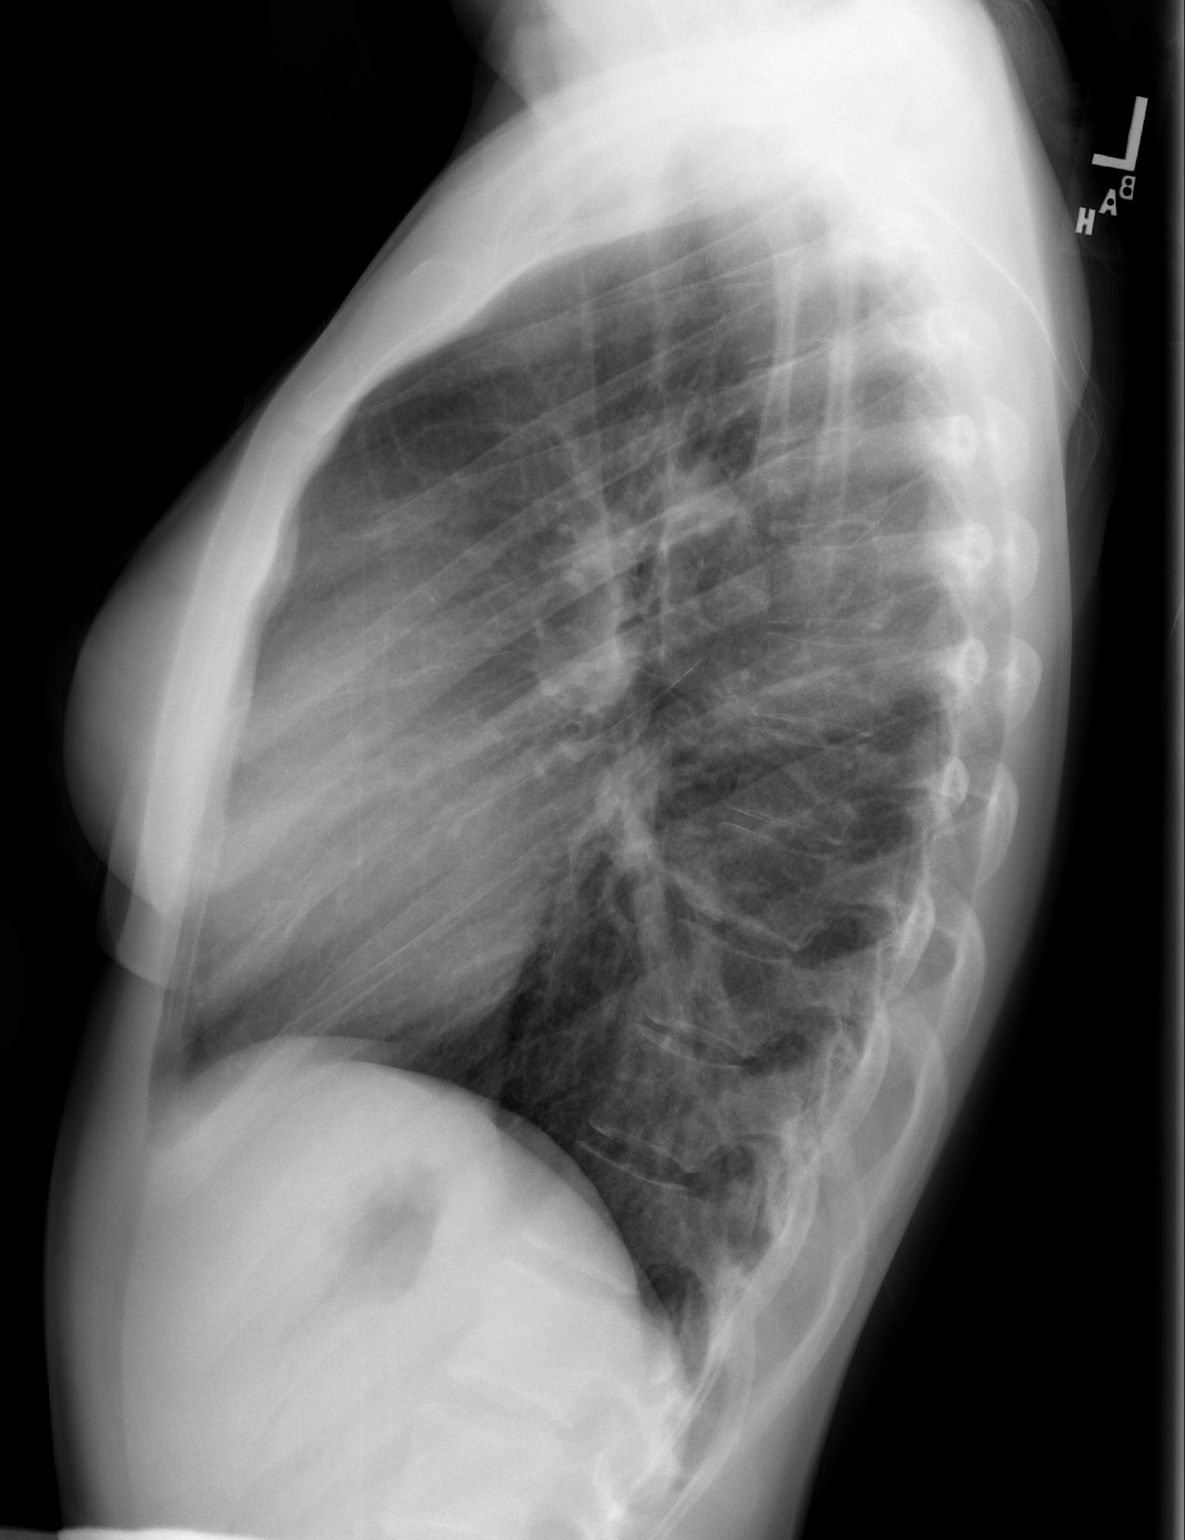

[2 of 2 positions shown; findings below may reference images not displayed]

FINDINGS: The lungs are clear bilaterally.  No confluent airspace
opacities, pleural effuions or pneumothoracies are seen.  The heart
is normal in size and contour.  The upper abdomen and osseous
structures are normal.
IMPRESSION: No acute cardiopulmonary disease.

## 2013-06-10 ENCOUNTER — Ambulatory Visit: Payer: Managed Care, Other (non HMO) | Admitting: Emergency Medicine

## 2013-06-10 VITALS — BP 118/80 | HR 93 | Temp 98.5°F | Resp 16 | Ht 64.5 in | Wt 121.0 lb

## 2013-06-10 DIAGNOSIS — T783XXA Angioneurotic edema, initial encounter: Secondary | ICD-10-CM

## 2013-06-10 DIAGNOSIS — E069 Thyroiditis, unspecified: Secondary | ICD-10-CM

## 2013-06-10 LAB — POCT CBC
GRANULOCYTE PERCENT: 42.3 % (ref 37–80)
HCT, POC: 41.2 % (ref 37.7–47.9)
Hemoglobin: 12.9 g/dL (ref 12.2–16.2)
Lymph, poc: 2.5 (ref 0.6–3.4)
MCH, POC: 29.1 pg (ref 27–31.2)
MCHC: 31.3 g/dL — AB (ref 31.8–35.4)
MCV: 93.1 fL (ref 80–97)
MID (CBC): 0.3 (ref 0–0.9)
MPV: 9.8 fL (ref 0–99.8)
PLATELET COUNT, POC: 435 10*3/uL — AB (ref 142–424)
POC GRANULOCYTE: 2 (ref 2–6.9)
POC LYMPH %: 51.1 % — AB (ref 10–50)
POC MID %: 6.6 % (ref 0–12)
RBC: 4.43 M/uL (ref 4.04–5.48)
RDW, POC: 13.4 %
WBC: 4.8 10*3/uL (ref 4.6–10.2)

## 2013-06-10 LAB — POCT SEDIMENTATION RATE: POCT SED RATE: 18 mm/hr (ref 0–22)

## 2013-06-10 LAB — POCT URINE PREGNANCY: PREG TEST UR: NEGATIVE

## 2013-06-10 MED ORDER — NON FORMULARY
10.0000 mg | Freq: Once | Status: AC
  Administered 2013-06-10: 10 mg via ORAL

## 2013-06-10 MED ORDER — PREDNISONE 10 MG PO TABS: ORAL_TABLET | ORAL | Status: DC

## 2013-06-10 MED ORDER — EPINEPHRINE 0.3 MG/0.3ML IJ SOAJ: 0.3000 mg | Freq: Once | INTRAMUSCULAR | Status: DC

## 2013-06-10 MED ORDER — DIPHENHYDRAMINE HCL 25 MG PO CAPS
25.0000 mg | ORAL_CAPSULE | Freq: Once | ORAL | Status: AC
  Administered 2013-06-10: 25 mg via ORAL

## 2013-06-10 MED ORDER — PREDNISOLONE 15 MG/5ML PO SOLN
60.0000 mg | Freq: Once | ORAL | Status: AC
  Administered 2013-06-10: 60 mg via ORAL

## 2013-06-10 MED ORDER — NON FORMULARY
150.0000 mg | Freq: Once | Status: AC
  Administered 2013-06-10: 150 mg via ORAL

## 2013-06-10 NOTE — Patient Instructions (Addendum)
Take Zyrtec 10 mg once a day. Take Benadryl 25 mg every 4-6 hours Take Zantac 150 mg twice a day. Take prednisone as instructed and start it tomorrow. Do not take your methimazole You have your EpiPen for emergency use  Angioedema Angioedema is a sudden swelling of tissues, often of the skin. It can occur on the face or genitals or in the abdomen or other body parts. The swelling usually develops over a short period and gets better in 24 to 48 hours. It often begins during the night and is found when the person wakes up. The person may also get red, itchy patches of skin (hives). Angioedema can be dangerous if it involves swelling of the air passages.  Depending on the cause, episodes of angioedema may only happen once, come back in unpredictable patterns, or repeat for several years and then gradually fade away.  CAUSES  Angioedema can be caused by an allergic reaction to various triggers. It can also result from nonallergic causes, including reactions to drugs, immune system disorders, viral infections, or an abnormal gene that is passed to you from your parents (hereditary). For some people with angioedema, the cause is unknown.  Some things that can trigger angioedema include:   Foods.   Medicines, such as ACE inhibitors, ARBs, nonsteroidal anti-inflammatory agents, or estrogen.   Latex.   Animal saliva.   Insect stings.   Dyes used in X-rays.   Mild injury.   Dental work.  Surgery.  Stress.   Sudden changes in temperature.   Exercise. SIGNS AND SYMPTOMS   Swelling of the skin.  Hives. If these are present, there is also intense itching.  Redness in the affected area.   Pain in the affected area.  Swollen lips or tongue.  Breathing problems. This may happen if the air passages swell.  Wheezing. If internal organs are involved, there may be:   Nausea.   Abdominal pain.   Vomiting.   Difficulty swallowing.   Difficulty passing  urine. DIAGNOSIS   Your health care provider will examine the affected area and take a medical and family history.  Various tests may be done to help determine the cause. Tests may include:  Allergy skin tests to see if the problem is an allergic reaction.   Blood tests to check for hereditary angioedema.   Tests to check for underlying diseases that could cause the condition.   A review of your medicines, including over the counter medicines, may be done. TREATMENT  Treatment will depend on the cause of the angioedema. Possible treatments include:   Removal of anything that triggered the condition (such as stopping certain medicines).   Medicines to treat symptoms or prevent attacks. Medicines given may include:   Antihistamines.   Epinephrine injection.   Steroids.   Hospitalization may be required for severe attacks. If the air passages are affected, it can be an emergency. Tubes may need to be placed to keep the airway open. HOME CARE INSTRUCTIONS   Only take over-the-counter or prescription medicines as directed by your health care provider.  If you were given medicines for emergency allergy treatment, always carry them with you.  Wear a medical bracelet as directed by your health care provider.   Avoid known triggers. SEEK MEDICAL CARE IF:   You have repeat attacks of angioedema.   Your attacks are more frequent or more severe despite preventive measures.   You have hereditary angioedema and are considering having children. It is important to discuss the  risks of passing the condition on to your children with your health care provider. SEEK IMMEDIATE MEDICAL CARE IF:   You have severe swelling of the mouth, tongue, or lips.  You have difficulty breathing.   You have difficulty swallowing.   You faint. MAKE SURE YOU:  Understand these instructions.  Will watch your condition.  Will get help right away if you are not doing well or get  worse. Document Released: 04/23/2001 Document Revised: 12/03/2012 Document Reviewed: 10/06/2012 [facility] Patient Information 2014 [place], [place].

## 2013-06-10 NOTE — Progress Notes (Signed)
Subjective:     Patient ID: Katie Bowers, female   DOB: 09/07/1990, 23 y.o.   MRN: [id number]  HPI Has seen Dr. Margaretmary BayleyPreston Bowers for management of graves disease for which she is taking methimazole. She has been on this medication since she was 23 years old. For the last 2 weeks she has noticed she has swelling redness and itching in various areas all over her body. Yesterday she woke up with right sided lip swelling which has progressed to the left side by the end of the day. She denies any breathing trouble or trouble swallowing but said her thyroid is fairly tender. She has been taking an OTC allergy medicine and done some home remedies to try and alleviate allergies. She doesn't know of any new contacts which would be causing her itching and swelling and no longer has a physician who is taking care of her thyroid. LMP April 9-12.    Review of Systems  Constitutional: Negative for fever, chills, diaphoresis, activity change, appetite change and fatigue.  HENT: Positive for facial swelling. Negative for congestion, ear discharge, ear pain, hearing loss, rhinorrhea, sinus pressure, sneezing and sore throat.        Lip swelling  Eyes: Negative for pain and redness.  Respiratory: Negative for cough, chest tightness, shortness of breath and wheezing.   Cardiovascular: Negative for chest pain, palpitations and leg swelling.  Gastrointestinal: Negative for nausea, vomiting, diarrhea, constipation, blood in stool and abdominal distention.  Endocrine: Negative for polyuria.  Genitourinary: Negative for dysuria, frequency and hematuria.  Musculoskeletal: Negative for arthralgias, joint swelling and myalgias.  Skin: Negative for rash.       Erythematous swollen hands  Allergic/Immunologic: Positive for environmental allergies.  Neurological: Negative for dizziness, syncope, weakness, numbness and headaches.   Prior to Admission medications   Medication Sig Start Date End Date Taking? Authorizing  Provider  methimazole (TAPAZOLE) 10 MG tablet Take 10 mg by mouth daily.     Yes Historical Provider, MD  amoxicillin (AMOXIL) 500 MG capsule Take 1 capsule (500 mg total) by mouth 3 (three) times daily. 04/01/12   Vida RollerBrian D Miller, MD  medroxyPROGESTERone (DEPO-PROVERA) 150 MG/ML injection Inject 150 mg into the muscle every 3 (three) months.    Historical Provider, MD  metoCLOPramide (REGLAN) 10 MG tablet Take 1 tablet (10 mg total) by mouth 3 (three) times daily as needed (headache / nausea). 04/01/12   Vida RollerBrian D Miller, MD  naproxen (NAPROSYN) 500 MG tablet Take 1 tablet (500 mg total) by mouth 2 (two) times daily with a meal. 04/01/12   Vida RollerBrian D Miller, MD       Objective:   Physical Exam  Constitutional: She is oriented to person, place, and time. She appears well-developed and well-nourished. No distress.  HENT:  Head: Normocephalic and atraumatic.  Right Ear: External ear normal.  Left Ear: External ear normal.  Nose: Nose normal.  Mouth/Throat: Oropharynx is clear and moist. No oropharyngeal exudate.  Angioedema of lip and right face, thyromegaly  Eyes: Conjunctivae are normal. Pupils are equal, round, and reactive to light. No scleral icterus.  Cardiovascular: Normal rate, regular rhythm, normal heart sounds and intact distal pulses.  Exam reveals no gallop and no friction rub.   No murmur heard. Pulmonary/Chest: Effort normal and breath sounds normal. She has no wheezes. She has no rales.  Abdominal: Soft. Bowel sounds are normal. She exhibits no distension. There is no tenderness. There is no rebound and no guarding.  Musculoskeletal:  Angioedema of the face and lip  Neurological: She is alert and oriented to person, place, and time.  Skin: Skin is warm and dry. She is not diaphoretic.  eythematous swollen hands   BP 118/80  Pulse 93  Temp(Src) 98.5 F (36.9 C) (Oral)  Resp 16  Ht 5' 4.5" (1.638 m)  Wt 121 lb (54.885 kg)  BMI 20.46 kg/m2  SpO2 100%  LMP 06/04/2013      Assessment:   1) angioedema 2) thyroiditis      Plan:   in office given zyrtec 10 mg, zantac 150 mg, benadryl 25 mg, prelone 20cc/ 60 mg she was instructed again as the use of an EpiPen. referral made to allergist and referral made to endocrinologist. She was told to stop her methimazole .  Given prescription for epi-pen and instructed personally on how to use epi-pen

## 2013-06-11 LAB — T4, FREE: FREE T4: 1.16 ng/dL (ref 0.80–1.80)

## 2013-06-11 LAB — TSH: TSH: 2.998 u[IU]/mL (ref 0.350–4.500)

## 2013-06-11 LAB — THYROID ANTIBODIES: Thyroperoxidase Ab SerPl-aCnc: 53.2 IU/mL — ABNORMAL HIGH (ref <35.0)

## 2013-06-14 LAB — THYROID STIMULATING IMMUNOGLOBULIN: TSI: 41 % baseline (ref <140)

## 2014-01-27 ENCOUNTER — Ambulatory Visit: Payer: Managed Care, Other (non HMO) | Admitting: Obstetrics

## 2014-01-29 ENCOUNTER — Ambulatory Visit (INDEPENDENT_AMBULATORY_CARE_PROVIDER_SITE_OTHER): Payer: BC Managed Care – PPO | Admitting: Obstetrics

## 2014-01-29 ENCOUNTER — Encounter: Payer: Self-pay | Admitting: Obstetrics

## 2014-01-29 VITALS — BP 117/76 | HR 76 | Temp 99.0°F | Ht 64.0 in | Wt 116.0 lb

## 2014-01-29 DIAGNOSIS — A499 Bacterial infection, unspecified: Secondary | ICD-10-CM

## 2014-01-29 DIAGNOSIS — N72 Inflammatory disease of cervix uteri: Secondary | ICD-10-CM

## 2014-01-29 DIAGNOSIS — N760 Acute vaginitis: Secondary | ICD-10-CM

## 2014-01-29 DIAGNOSIS — B9689 Other specified bacterial agents as the cause of diseases classified elsewhere: Secondary | ICD-10-CM

## 2014-01-29 DIAGNOSIS — Z3009 Encounter for other general counseling and advice on contraception: Secondary | ICD-10-CM

## 2014-01-29 DIAGNOSIS — Z01419 Encounter for gynecological examination (general) (routine) without abnormal findings: Secondary | ICD-10-CM

## 2014-02-01 LAB — PAP IG W/ RFLX HPV ASCU

## 2014-02-03 ENCOUNTER — Telehealth: Payer: Self-pay | Admitting: *Deleted

## 2014-02-03 ENCOUNTER — Encounter: Payer: Self-pay | Admitting: Obstetrics

## 2014-02-03 ENCOUNTER — Ambulatory Visit: Payer: Managed Care, Other (non HMO) | Admitting: Obstetrics

## 2014-02-03 MED ORDER — AZITHROMYCIN 250 MG PO TABS: 1000.0000 mg | ORAL_TABLET | Freq: Once | ORAL | Status: DC

## 2014-02-03 MED ORDER — CEFIXIME 400 MG PO TABS: 400.0000 mg | ORAL_TABLET | Freq: Once | ORAL | Status: DC

## 2014-02-03 MED ORDER — METRONIDAZOLE 500 MG PO TABS
500.0000 mg | ORAL_TABLET | Freq: Two times a day (BID) | ORAL | Status: DC
Start: 2014-02-03 — End: 2014-05-07

## 2014-02-03 NOTE — Addendum Note (Signed)
Addended by: Coral CeoHARPER, Ameriah Lint A on: 02/03/2014 11:02 AM   Modules accepted: Orders

## 2014-02-03 NOTE — Telephone Encounter (Signed)
Attempt to contact pt regarding lab results.  Pt number in chart is not a working number.   Placed call to pt mother, as contact, to get in contact with pt.   No answer on mother number.  LM on VM to have pt contact office.

## 2014-02-03 NOTE — Progress Notes (Signed)
Subjective:     Katie Bowers is a 23 y.o. female here for a routine exam.  Current complaints: none  Personal health questionnaire:  Is patient [name], have a family history of breast and/or ovarian cancer: no Is there a family history of uterine cancer diagnosed at age < 3350, gastrointestinal cancer, urinary tract cancer, family member who is a Personnel officerLynch syndrome-associated carrier: no Is the patient overweight and hypertensive, family history of diabetes, personal history of gestational diabetes or PCOS: no Is patient over 255, have PCOS,  family history of premature CHD under age 23, diabetes, smoke, have hypertension or peripheral artery disease:  no At any time, has a partner hit, kicked or otherwise hurt or frightened you?: no Over the past 2 weeks, have you felt down, depressed or hopeless?: no Over the past 2 weeks, have you felt little interest or pleasure in doing things?:no   Gynecologic History Patient's last menstrual period was 01/14/2014. Contraception: condoms Last Pap: 2014. Results were: normal Last mammogram: n/a. Results were: n/a  Obstetric History OB History  No data available    Past Medical History  Diagnosis Date  . 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 Chlamydia 06/2009, 12/2009  . ASCUS (atypical squamous cells of undetermined significance) on Pap smear 12/2009    with high grade HPV  . Grave's disease 06/2010    sees Dr. Margaretmary BayleyPreston Clark    History reviewed. No pertinent past surgical history.  Current outpatient prescriptions: EPINEPHrine (EPI-PEN) 0.3 mg/0.3 mL SOAJ injection, Inject 0.3 mLs (0.3 mg total) into the muscle once. (Patient not taking: Reported on 01/29/2014), Disp: 1 Device, Rfl: 3;  metoCLOPramide (REGLAN) 10 MG tablet, Take 1 tablet (10 mg total) by mouth 3 (three) times daily as needed (headache / nausea). (Patient not taking: Reported on 01/29/2014), Disp: 20 tablet, Rfl: 0 naproxen (NAPROSYN) 500 MG tablet,  Take 1 tablet (500 mg total) by mouth 2 (two) times daily with a meal. (Patient not taking: Reported on 01/29/2014), Disp: 30 tablet, Rfl: 0;  predniSONE (DELTASONE) 10 MG tablet, Take 4 a day for 3 days 3 a day for 3 days 2 a day for 3 days one a day for 3 days (Patient not taking: Reported on 01/29/2014), Disp: 30 tablet, Rfl: 0 Allergies  Allergen Reactions  . Methimazole [Thiamazole] Swelling    Face and tongue.     History  Substance Use Topics  . Smoking status: Never Smoker   . Smokeless tobacco: Never Used  . Alcohol Use: Yes     Comment: 2 drinks every other month.    Family History  Problem Relation Age of Onset  . Hyperlipidemia Mother   . Hypothyroidism Mother   . Cancer Maternal Uncle     lung cancer (smoker)  . Hyperthyroidism Paternal Aunt   . Hypertension Maternal Grandmother   . Hypothyroidism Paternal Grandmother       Review of Systems  Constitutional: negative for fatigue and weight loss Respiratory: negative for cough and wheezing Cardiovascular: negative for chest pain, fatigue and palpitations Gastrointestinal: negative for abdominal pain and change in bowel habits Musculoskeletal:negative for myalgias Neurological: negative for gait problems and tremors Behavioral/Psych: negative for abusive relationship, depression Endocrine: negative for temperature intolerance   Genitourinary:negative for abnormal menstrual periods, genital lesions, hot flashes, sexual problems and vaginal discharge Integument/breast: negative for breast lump, breast tenderness, nipple discharge and skin lesion(s)    Objective:       BP 117/76 mmHg  Pulse  76  Temp(Src) 99 F (37.2 C)  Ht 5\' 4"  (1.626 m)  Wt 116 lb (52.617 kg)  BMI 19.90 kg/m2  LMP 01/14/2014 General:   alert  Skin:   no rash or abnormalities  Lungs:   clear to auscultation bilaterally  Heart:   regular rate and rhythm, S1, S2 normal, no murmur, click, rub or gallop  Breasts:   normal without suspicious  masses, skin or nipple changes or axillary nodes  Abdomen:  normal findings: no organomegaly, soft, non-tender and no hernia  Pelvis:  External genitalia: normal general appearance Urinary system: urethral meatus normal and bladder without fullness, nontender Vaginal: normal without tenderness, induration or masses Cervix: normal appearance Adnexa: normal bimanual exam Uterus: anteverted and non-tender, normal size   Lab Review Urine pregnancy test Labs reviewed yes Radiologic studies reviewed no    Assessment:    Healthy female exam.    Contraceptive counseling.   Plan:    Education reviewed: low fat, low cholesterol diet, safe sex/STD prevention and weight bearing exercise. Contraception: Options discussed. Follow up in: 1 year.   No orders of the defined types were placed in this encounter.   Orders Placed This Encounter  Procedures  . SureSwab, Vaginosis/Vaginitis Plus

## 2014-02-04 ENCOUNTER — Telehealth: Payer: Self-pay | Admitting: *Deleted

## 2014-02-04 NOTE — Telephone Encounter (Signed)
Placed call to pt regarding SureSwab results. Pt was informed that she is positive for BV and Chlamydia.  Pt was made aware that Rx has been sent to pharmacy by Dr Clearance CootsHarper.  Pt made aware that partner also needs treatment and they should not have intercourse for 2 weeks after treatment.  Pt made aware that health dept will also be notified of these results.  Pt advised to follow up in office in 2-3 mo for TOC. Pt states understanding with no other concerns.

## 2014-02-05 ENCOUNTER — Ambulatory Visit (INDEPENDENT_AMBULATORY_CARE_PROVIDER_SITE_OTHER): Payer: BC Managed Care – PPO | Admitting: *Deleted

## 2014-02-05 VITALS — BP 108/63 | HR 77

## 2014-02-05 DIAGNOSIS — Z30013 Encounter for initial prescription of injectable contraceptive: Secondary | ICD-10-CM

## 2014-02-05 DIAGNOSIS — Z3202 Encounter for pregnancy test, result negative: Secondary | ICD-10-CM

## 2014-02-05 LAB — POCT URINE PREGNANCY: PREG TEST UR: NEGATIVE

## 2014-02-05 MED ORDER — MEDROXYPROGESTERONE ACETATE 150 MG/ML IM SUSP
150.0000 mg | Freq: Once | INTRAMUSCULAR | Status: AC
  Administered 2014-02-05: 150 mg via INTRAMUSCULAR

## 2014-02-05 NOTE — Progress Notes (Signed)
Pt is in office today for Depo injection.  Pt was seen by Dr Clearance CootsHarper last week and was approved to start Depo.  Pt tolerated injection well.  Pt advised to RTO on 04/30/14 for next injection.  Pt has no other concerns today.   BP 108/63 mmHg  Pulse 77  LMP 01/14/2014   Administrations This Visit    medroxyPROGESTERone (DEPO-PROVERA) injection 150 mg    Administered Action Dose Route Administered By         02/05/2014 Given 150 mg Intramuscular Lanney GinsSuzanne D Natanya Holecek, CMA

## 2014-02-09 NOTE — Telephone Encounter (Signed)
Pt has been made aware of results

## 2014-04-30 ENCOUNTER — Other Ambulatory Visit: Payer: Self-pay | Admitting: *Deleted

## 2014-04-30 DIAGNOSIS — Z3042 Encounter for surveillance of injectable contraceptive: Secondary | ICD-10-CM

## 2014-04-30 MED ORDER — MEDROXYPROGESTERONE ACETATE 150 MG/ML IM SUSP: 150.0000 mg | INTRAMUSCULAR | Status: DC

## 2014-04-30 NOTE — Progress Notes (Signed)
Refill sent to pharmacy for depo.  Pt was seen in office for AEX in December.  Rx was previously canceled. Pt has been made aware and has appt scheduled for injection.

## 2014-05-07 ENCOUNTER — Ambulatory Visit (INDEPENDENT_AMBULATORY_CARE_PROVIDER_SITE_OTHER): Payer: Managed Care, Other (non HMO) | Admitting: *Deleted

## 2014-05-07 VITALS — BP 108/70 | HR 93 | Temp 98.8°F | Wt 123.0 lb

## 2014-05-07 DIAGNOSIS — Z3042 Encounter for surveillance of injectable contraceptive: Secondary | ICD-10-CM

## 2014-05-07 MED ORDER — MEDROXYPROGESTERONE ACETATE 150 MG/ML IM SUSP
150.0000 mg | Freq: Once | INTRAMUSCULAR | Status: AC
  Administered 2014-05-07: 150 mg via INTRAMUSCULAR

## 2014-05-07 NOTE — Progress Notes (Signed)
Pt is in office today for depo injection. Pt is on time for injection.  Pt tolerated well.  Pt advised to RTO on 07-29-14 for next injection.   Pt has no other concerns today.   BP 108/70 mmHg  Pulse 93  Temp(Src) 98.8 F (37.1 C)  Wt 123 lb (55.792 kg)  Administrations This Visit    medroxyPROGESTERone (DEPO-PROVERA) injection 150 mg    Admin Date Action Dose Route Administered By         05/07/2014 Given 150 mg Intramuscular Lanney GinsSuzanne D Faisal Stradling, CMA

## 2014-05-18 ENCOUNTER — Ambulatory Visit (INDEPENDENT_AMBULATORY_CARE_PROVIDER_SITE_OTHER): Payer: BLUE CROSS/BLUE SHIELD | Admitting: Obstetrics

## 2014-05-18 ENCOUNTER — Encounter: Payer: Self-pay | Admitting: Obstetrics

## 2014-05-18 ENCOUNTER — Other Ambulatory Visit: Payer: Self-pay | Admitting: Obstetrics

## 2014-05-18 VITALS — BP 112/76 | HR 82 | Temp 98.5°F | Wt 125.0 lb

## 2014-05-18 DIAGNOSIS — N898 Other specified noninflammatory disorders of vagina: Secondary | ICD-10-CM

## 2014-05-18 DIAGNOSIS — S30820A Blister (nonthermal) of lower back and pelvis, initial encounter: Secondary | ICD-10-CM | POA: Diagnosis not present

## 2014-05-18 NOTE — Progress Notes (Signed)
Patient ID: Katie Bowers, female   DOB: 1990/12/31, 24 y.o.   MRN: [id number]  Chief Complaint  Patient presents with  . Vaginitis    would like exam r/o BV, has bump at anus    HPI T [name] is a 24 y.o. female.  Bumps around anal area for past 3 days, tender.  HPI  Past Medical History  Diagnosis Date  . 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 Chlamydia 06/2009, 12/2009  . ASCUS (atypical squamous cells of undetermined significance) on Pap smear 12/2009    with high grade HPV  . Grave's disease 06/2010    sees Dr. Margaretmary Bayley    History reviewed. No pertinent past surgical history.  Family History  Problem Relation Age of Onset  . Hyperlipidemia Mother   . Hypothyroidism Mother   . Cancer Maternal Uncle     lung cancer (smoker)  . Hyperthyroidism Paternal Aunt   . Hypertension Maternal Grandmother   . Hypothyroidism Paternal Grandmother     Social History History  Substance Use Topics  . Smoking status: Never Smoker   . Smokeless tobacco: Never Used  . Alcohol Use: Yes     Comment: 2 drinks every other month.    Allergies  Allergen Reactions  . Methimazole [Thiamazole] Swelling    Face and tongue.     Current Outpatient Prescriptions  Medication Sig Dispense Refill  . levocetirizine (XYZAL) 5 MG tablet Take 5 mg by mouth every evening.    Marland Kitchen EPINEPHrine (EPI-PEN) 0.3 mg/0.3 mL SOAJ injection Inject 0.3 mLs (0.3 mg total) into the muscle once. (Patient not taking: Reported on 01/29/2014) 1 Device 3  . medroxyPROGESTERone (DEPO-PROVERA) 150 MG/ML injection Inject 1 mL (150 mg total) into the muscle every 3 (three) months. 1 mL 2  . naproxen (NAPROSYN) 500 MG tablet Take 1 tablet (500 mg total) by mouth 2 (two) times daily with a meal. (Patient not taking: Reported on 01/29/2014) 30 tablet 0  . predniSONE (DELTASONE) 10 MG tablet Take 4 a day for 3 days 3 a day for 3 days 2 a day for 3 days one a day for 3 days (Patient not taking:  Reported on 01/29/2014) 30 tablet 0   No current facility-administered medications for this visit.    Review of Systems Review of Systems Constitutional: negative for fatigue and weight loss Respiratory: negative for cough and wheezing Cardiovascular: negative for chest pain, fatigue and palpitations Gastrointestinal: negative for abdominal pain and change in bowel habits.  Tender erosive lesions around anus Genitourinary:negative Integument/breast: negative for nipple discharge Musculoskeletal:negative for myalgias Neurological: negative for gait problems and tremors Behavioral/Psych: negative for abusive relationship, depression Endocrine: negative for temperature intolerance     Blood pressure 112/76, pulse 82, temperature 98.5 F (36.9 C), weight 125 lb (56.7 kg).  Physical Exam Physical Exam General:   alert  Skin:   no rash or abnormalities  Lungs:   clear to auscultation bilaterally  Heart:   regular rate and rhythm, S1, S2 normal, no murmur, click, rub or gallop  Breasts:   normal without suspicious masses, skin or nipple changes or axillary nodes  Abdomen:  normal findings: no organomegaly, soft, non-tender and no hernia  Pelvis:  External genitalia: normal general appearance Urinary system: urethral meatus normal and bladder without fullness, nontender Vaginal: normal without tenderness, induration or masses Cervix: normal appearance Adnexa: normal bimanual exam Uterus: anteverted and non-tender, normal size      Data Reviewed Labs  Assessment     Erosive, tender perianal lesions.  R/O Herpes     Plan    Herpes cultures done. Will F/U with results.  Orders Placed This Encounter  Procedures  . SureSwab, Vaginosis/Vaginitis Plus  . Herpes simplex virus culture    Type 1 and 2   Meds ordered this encounter  Medications  . levocetirizine (XYZAL) 5 MG tablet    Sig: Take 5 mg by mouth every evening.

## 2014-05-20 LAB — HERPES SIMPLEX VIRUS CULTURE: ORGANISM ID, BACTERIA: DETECTED

## 2014-05-21 ENCOUNTER — Other Ambulatory Visit: Payer: Self-pay | Admitting: Obstetrics

## 2014-05-21 DIAGNOSIS — B9689 Other specified bacterial agents as the cause of diseases classified elsewhere: Secondary | ICD-10-CM

## 2014-05-21 DIAGNOSIS — N760 Acute vaginitis: Principal | ICD-10-CM

## 2014-05-21 LAB — SURESWAB, VAGINOSIS/VAGINITIS PLUS
Atopobium vaginae: 6.8 Log (cells/mL)
C. ALBICANS, DNA: NOT DETECTED
C. PARAPSILOSIS, DNA: NOT DETECTED
C. TRACHOMATIS RNA, TMA: NOT DETECTED
C. glabrata, DNA: NOT DETECTED
C. tropicalis, DNA: NOT DETECTED
Gardnerella vaginalis: >8 Log (cells/mL)
LACTOBACILLUS SPECIES: NOT DETECTED Log (cells/mL)
N. GONORRHOEAE RNA, TMA: NOT DETECTED
T. vaginalis RNA, QL TMA: NOT DETECTED

## 2014-05-21 MED ORDER — TINIDAZOLE 500 MG PO TABS: 1000.0000 mg | ORAL_TABLET | Freq: Every day | ORAL | Status: DC

## 2014-05-25 ENCOUNTER — Other Ambulatory Visit: Payer: Self-pay | Admitting: *Deleted

## 2014-05-25 DIAGNOSIS — A6009 Herpesviral infection of other urogenital tract: Secondary | ICD-10-CM

## 2014-05-25 MED ORDER — VALACYCLOVIR HCL 500 MG PO TABS: ORAL_TABLET | ORAL | Status: DC

## 2014-07-30 ENCOUNTER — Ambulatory Visit: Payer: BLUE CROSS/BLUE SHIELD

## 2014-07-30 ENCOUNTER — Ambulatory Visit: Payer: Self-pay

## 2014-08-02 ENCOUNTER — Ambulatory Visit (INDEPENDENT_AMBULATORY_CARE_PROVIDER_SITE_OTHER): Payer: BLUE CROSS/BLUE SHIELD | Admitting: *Deleted

## 2014-08-02 VITALS — BP 118/69 | HR 113 | Temp 98.9°F | Ht 63.0 in | Wt 125.0 lb

## 2014-08-02 DIAGNOSIS — Z3042 Encounter for surveillance of injectable contraceptive: Secondary | ICD-10-CM

## 2014-08-02 MED ORDER — MEDROXYPROGESTERONE ACETATE 150 MG/ML IM SUSP
150.0000 mg | INTRAMUSCULAR | Status: AC
  Administered 2014-08-02 – 2015-04-08 (×4): 150 mg via INTRAMUSCULAR

## 2014-08-02 NOTE — Progress Notes (Signed)
Patient in office today for a Depo Injection. Patient is on time for her injection.  Patient tolerated injection well. Patient to return for next injection October 24, 2014.  BP 118/69 mmHg  Pulse 113  Temp(Src) 98.9 F (37.2 C)  Ht 5\' 3"  (1.6 m)  Wt 125 lb (56.7 kg)  BMI 22.15 kg/m2   Administrations This Visit    medroxyPROGESTERone (DEPO-PROVERA) injection 150 mg    Admin Date Action Dose Route Administered By         08/02/2014 Given 150 mg Intramuscular Henriette CombsAndrea L Tierre Netto, LPN

## 2014-10-21 ENCOUNTER — Ambulatory Visit: Payer: BLUE CROSS/BLUE SHIELD | Admitting: *Deleted

## 2014-10-21 VITALS — BP 105/71 | HR 74 | Temp 98.6°F | Wt 128.2 lb

## 2014-10-21 DIAGNOSIS — Z3042 Encounter for surveillance of injectable contraceptive: Secondary | ICD-10-CM

## 2014-10-21 LAB — POCT URINE PREGNANCY: Preg Test, Ur: NEGATIVE

## 2014-10-21 NOTE — Progress Notes (Signed)
Pt was seen in the office to receive her depo injection today. Pt received injection on the left quadrant area( buttocks). Pt took injection well with no problems. Pt is aware of next injection date on January 12, 2015.  Injection was prepared and given by Blenda Mounts on 10-21-14.

## 2014-10-25 ENCOUNTER — Ambulatory Visit: Payer: BLUE CROSS/BLUE SHIELD

## 2014-11-06 ENCOUNTER — Emergency Department (HOSPITAL_COMMUNITY)
Admission: EM | Admit: 2014-11-06 | Discharge: 2014-11-07 | Discharge status: Against Medical Advice | Payer: No Typology Code available for payment source | Attending: Emergency Medicine | Admitting: Emergency Medicine

## 2014-11-06 ENCOUNTER — Encounter (HOSPITAL_COMMUNITY): Payer: Self-pay | Admitting: *Deleted

## 2014-11-06 ENCOUNTER — Emergency Department (HOSPITAL_COMMUNITY): Payer: No Typology Code available for payment source

## 2014-11-06 DIAGNOSIS — R0602 Shortness of breath: Secondary | ICD-10-CM | POA: Insufficient documentation

## 2014-11-06 DIAGNOSIS — R55 Syncope and collapse: Secondary | ICD-10-CM | POA: Insufficient documentation

## 2014-11-06 LAB — CBC
HEMATOCRIT: 42.3 % (ref 36.0–46.0)
HEMOGLOBIN: 14.3 g/dL (ref 12.0–15.0)
MCH: 30.2 pg (ref 26.0–34.0)
MCHC: 33.8 g/dL (ref 30.0–36.0)
MCV: 89.2 fL (ref 78.0–100.0)
PLATELETS: 341 10*3/uL (ref 150–400)
RBC: 4.74 MIL/uL (ref 3.87–5.11)
RDW: 12 % (ref 11.5–15.5)
WBC: 6.1 10*3/uL (ref 4.0–10.5)

## 2014-11-06 LAB — BASIC METABOLIC PANEL
Anion gap: 13 (ref 5–15)
BUN: 6 mg/dL (ref 6–20)
CHLORIDE: 106 mmol/L (ref 101–111)
CO2: 19 mmol/L — AB (ref 22–32)
CREATININE: 0.95 mg/dL (ref 0.44–1.00)
Calcium: 10 mg/dL (ref 8.9–10.3)
GFR calc non Af Amer: >60 mL/min (ref >60)
Glucose, Bld: 111 mg/dL — ABNORMAL HIGH (ref 65–99)
POTASSIUM: 3.7 mmol/L (ref 3.5–5.1)
Sodium: 138 mmol/L (ref 135–145)

## 2014-11-06 LAB — I-STAT BETA HCG BLOOD, ED (MC, WL, AP ONLY): I-stat hCG, quantitative: <5 m[IU]/mL (ref <5)

## 2014-11-06 NOTE — ED Notes (Signed)
[place], became sob and near syncope. No acute distress noted at triage, ekg done. Airway intact.

## 2014-11-06 NOTE — ED Notes (Signed)
Pt called in waiting room and no answer 

## 2015-01-13 ENCOUNTER — Other Ambulatory Visit: Payer: Self-pay | Admitting: Obstetrics

## 2015-01-14 ENCOUNTER — Ambulatory Visit (INDEPENDENT_AMBULATORY_CARE_PROVIDER_SITE_OTHER): Payer: BLUE CROSS/BLUE SHIELD | Admitting: *Deleted

## 2015-01-14 ENCOUNTER — Ambulatory Visit: Payer: BLUE CROSS/BLUE SHIELD

## 2015-01-14 VITALS — BP 107/71 | HR 92 | Wt 131.0 lb

## 2015-01-14 DIAGNOSIS — Z3042 Encounter for surveillance of injectable contraceptive: Secondary | ICD-10-CM

## 2015-01-14 NOTE — Progress Notes (Signed)
Pt is in office today for depo injection.  Pt is on time for injection.  Pt tolerated injection well. Pt was advised to RTO on 04-06-14 for next injection. Pt is scheduled for AEX on 01-31-15 and will need refill on depo. Pt has no other concerns today.  BP 107/71 mmHg  Pulse 92  Wt 131 lb (59.421 kg)  Administrations This Visit    medroxyPROGESTERone (DEPO-PROVERA) injection 150 mg    Admin Date Action Dose Route Administered By         01/14/2015 Given 150 mg Intramuscular Lanney GinsSuzanne D Keaisha Sublette, CMA

## 2015-01-31 ENCOUNTER — Encounter: Payer: Self-pay | Admitting: Obstetrics

## 2015-01-31 ENCOUNTER — Ambulatory Visit (INDEPENDENT_AMBULATORY_CARE_PROVIDER_SITE_OTHER): Payer: BLUE CROSS/BLUE SHIELD | Admitting: Obstetrics

## 2015-01-31 VITALS — BP 119/79 | HR 81 | Temp 98.2°F | Wt 134.0 lb

## 2015-01-31 DIAGNOSIS — Z01419 Encounter for gynecological examination (general) (routine) without abnormal findings: Secondary | ICD-10-CM | POA: Diagnosis not present

## 2015-01-31 DIAGNOSIS — Z3042 Encounter for surveillance of injectable contraceptive: Secondary | ICD-10-CM

## 2015-01-31 DIAGNOSIS — N898 Other specified noninflammatory disorders of vagina: Secondary | ICD-10-CM

## 2015-01-31 NOTE — Progress Notes (Signed)
Subjective:        T Katie Bowers is a 24 y.o. female here for a routine exam.  Current complaints: none.    Personal health questionnaire:  Is patient [name], have a family history of breast and/or ovarian cancer: no Is there a family history of uterine cancer diagnosed at age < 3550, gastrointestinal cancer, urinary tract cancer, family member who is a Personnel officerLynch syndrome-associated carrier: no Is the patient overweight and hypertensive, family history of diabetes, personal history of gestational diabetes, preeclampsia or PCOS: no Is patient over 6055, have PCOS,  family history of premature CHD under age 24, diabetes, smoke, have hypertension or peripheral artery disease:  no At any time, has a partner hit, kicked or otherwise hurt or frightened you?: no Over the past 2 weeks, have you felt down, depressed or hopeless?: no Over the past 2 weeks, have you felt little interest or pleasure in doing things?:no   Gynecologic History No LMP recorded. Patient has had an injection. Contraception: Depo-Provera injections Last Pap: 2015. Results were: normal Last mammogram: n/a. Results were: n/a  Obstetric History OB History  No data available    Past Medical History  Diagnosis Date  . 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 Chlamydia 06/2009, 12/2009  . ASCUS (atypical squamous cells of undetermined significance) on Pap smear 12/2009    with high grade HPV  . Grave's disease 06/2010    sees Dr. Margaretmary BayleyPreston Clark    History reviewed. No pertinent past surgical history.   Current outpatient prescriptions:  .  medroxyPROGESTERone (DEPO-PROVERA) 150 MG/ML injection, use as directed, Disp: 1 mL, Rfl: 0 .  levocetirizine (XYZAL) 5 MG tablet, Take 5 mg by mouth every evening., Disp: , Rfl:  .  valACYclovir (VALTREX) 500 MG tablet, For initial diagnosis use 2 tablets twice daily for 10 days. For episodic outbreaks use 1 tablet twice daily for 3 days. (Patient not  taking: Reported on 08/02/2014), Disp: 60 tablet, Rfl: 5  Current facility-administered medications:  .  medroxyPROGESTERone (DEPO-PROVERA) injection 150 mg, 150 mg, Intramuscular, Q90 days, Brock Badharles A Theus Espin, MD, 150 mg at 01/14/15 1111 Allergies  Allergen Reactions  . Methimazole [Methimazole] Swelling    Face and tongue.     Social History  Substance Use Topics  . Smoking status: Never Smoker   . Smokeless tobacco: Never Used  . Alcohol Use: 0.0 oz/week    0 Standard drinks or equivalent per week     Comment: 2 drinks every other month.    Family History  Problem Relation Age of Onset  . Hyperlipidemia Mother   . Hypothyroidism Mother   . Cancer Maternal Uncle     lung cancer (smoker)  . Hyperthyroidism Paternal Aunt   . Hypertension Maternal Grandmother   . Hypothyroidism Paternal Grandmother       Review of Systems  Constitutional: negative for fatigue and weight loss Respiratory: negative for cough and wheezing Cardiovascular: negative for chest pain, fatigue and palpitations Gastrointestinal: negative for abdominal pain and change in bowel habits Musculoskeletal:negative for myalgias Neurological: negative for gait problems and tremors Behavioral/Psych: negative for abusive relationship, depression Endocrine: negative for temperature intolerance   Genitourinary:negative for abnormal menstrual periods, genital lesions, hot flashes, sexual problems and vaginal discharge Integument/breast: negative for breast lump, breast tenderness, nipple discharge and skin lesion(s)    Objective:       BP 119/79 mmHg  Pulse 81  Temp(Src) 98.2 F (36.8 C)  Wt 134 lb (60.782  kg) General:   alert  Skin:   no rash or abnormalities  Lungs:   clear to auscultation bilaterally  Heart:   regular rate and rhythm, S1, S2 normal, no murmur, click, rub or gallop  Breasts:   normal without suspicious masses, skin or nipple changes or axillary nodes  Abdomen:  normal findings: no  organomegaly, soft, non-tender and no hernia  Pelvis:  External genitalia: normal general appearance Urinary system: urethral meatus normal and bladder without fullness, nontender Vaginal: normal without tenderness, induration or masses.  Gray, thin malodorous vaginal discharge Cervix: normal appearance Adnexa: normal bimanual exam Uterus: anteverted and non-tender, normal size   Lab Review Urine pregnancy test Labs reviewed yes Radiologic studies reviewed no   Assessment:    Healthy female exam.    Vaginitis.  Probably BV.  Surveillance of Depo Provera.  Pleased.   Plan:   Wet prep and cultures sent Continue Depo Provera  Education reviewed: low fat, low cholesterol diet, safe sex/STD prevention, self breast exams and weight bearing exercise. Contraception: Depo-Provera injections. Follow up in: 1 year.   No orders of the defined types were placed in this encounter.   Orders Placed This Encounter  Procedures  . SureSwab, Vaginosis/Vaginitis Plus

## 2015-02-02 LAB — PAP IG W/ RFLX HPV ASCU

## 2015-02-03 LAB — SURESWAB, VAGINOSIS/VAGINITIS PLUS
ATOPOBIUM VAGINAE: 5.2 Log (cells/mL)
C. albicans, DNA: NOT DETECTED
C. glabrata, DNA: NOT DETECTED
C. parapsilosis, DNA: NOT DETECTED
C. trachomatis RNA, TMA: NOT DETECTED
C. tropicalis, DNA: NOT DETECTED
LACTOBACILLUS SPECIES: NOT DETECTED Log (cells/mL)
N. gonorrhoeae RNA, TMA: NOT DETECTED
T. vaginalis RNA, QL TMA: NOT DETECTED

## 2015-02-04 ENCOUNTER — Other Ambulatory Visit: Payer: Self-pay | Admitting: Obstetrics

## 2015-02-04 DIAGNOSIS — B9689 Other specified bacterial agents as the cause of diseases classified elsewhere: Secondary | ICD-10-CM

## 2015-02-04 DIAGNOSIS — N760 Acute vaginitis: Principal | ICD-10-CM

## 2015-02-04 MED ORDER — METRONIDAZOLE 500 MG PO TABS: 500.0000 mg | ORAL_TABLET | Freq: Two times a day (BID) | ORAL | Status: DC

## 2015-02-07 ENCOUNTER — Other Ambulatory Visit: Payer: Self-pay | Admitting: *Deleted

## 2015-02-07 DIAGNOSIS — A6009 Herpesviral infection of other urogenital tract: Secondary | ICD-10-CM

## 2015-02-07 MED ORDER — VALACYCLOVIR HCL 500 MG PO TABS: ORAL_TABLET | ORAL | Status: DC

## 2015-02-07 NOTE — Progress Notes (Signed)
See lab note.  

## 2015-04-05 ENCOUNTER — Other Ambulatory Visit: Payer: Self-pay | Admitting: *Deleted

## 2015-04-05 DIAGNOSIS — Z3042 Encounter for surveillance of injectable contraceptive: Secondary | ICD-10-CM

## 2015-04-05 MED ORDER — MEDROXYPROGESTERONE ACETATE 150 MG/ML IM SUSP: 150.0000 mg | INTRAMUSCULAR | Status: DC

## 2015-04-08 ENCOUNTER — Ambulatory Visit (INDEPENDENT_AMBULATORY_CARE_PROVIDER_SITE_OTHER): Payer: BLUE CROSS/BLUE SHIELD | Admitting: *Deleted

## 2015-04-08 VITALS — BP 110/76 | HR 76 | Wt 134.0 lb

## 2015-04-08 DIAGNOSIS — Z3042 Encounter for surveillance of injectable contraceptive: Secondary | ICD-10-CM

## 2015-04-08 NOTE — Progress Notes (Signed)
Pt is in office today for depo injection. Pt is on time for her injection.  Pt tolerated injection well. Pt advised to RTO on Jun 30, 2015 for next injection. Pt has no other concerns today.  Administrations This Visit    medroxyPROGESTERone (DEPO-PROVERA) injection 150 mg    Admin Date Action Dose Route Administered By         04/08/2015 Given 150 mg Intramuscular Lanney Gins, CMA

## 2015-06-30 ENCOUNTER — Ambulatory Visit: Payer: BLUE CROSS/BLUE SHIELD

## 2015-06-30 VITALS — BP 101/80 | HR 82 | Temp 98.4°F | Wt 132.0 lb

## 2015-06-30 DIAGNOSIS — Z3042 Encounter for surveillance of injectable contraceptive: Secondary | ICD-10-CM

## 2015-06-30 MED ORDER — MEDROXYPROGESTERONE ACETATE 150 MG/ML IM SUSP
150.0000 mg | Freq: Once | INTRAMUSCULAR | Status: AC
  Administered 2015-06-30: 150 mg via INTRAMUSCULAR

## 2015-06-30 NOTE — Progress Notes (Unsigned)
Patient tolerated Depo injection well in RUOQ. RTO 09-21-15.

## 2015-09-23 ENCOUNTER — Ambulatory Visit: Payer: BLUE CROSS/BLUE SHIELD

## 2015-09-23 DIAGNOSIS — Z3042 Encounter for surveillance of injectable contraceptive: Secondary | ICD-10-CM

## 2015-09-23 MED ORDER — MEDROXYPROGESTERONE ACETATE 150 MG/ML IM SUSP
150.0000 mg | Freq: Once | INTRAMUSCULAR | Status: AC
  Administered 2015-09-23: 150 mg via INTRAMUSCULAR

## 2015-09-23 NOTE — Progress Notes (Unsigned)
Pt is in office today for depo injection. Pt is on time for injection. Pt tolerated injection well. Pt advised to RTO on 12-15-15 for next injection.  Pt has no other concerns today.  Administrations This Visit    medroxyPROGESTERone (DEPO-PROVERA) injection 150 mg    Admin Date 09/23/2015 Action Given Dose 150 mg Route Intramuscular Administered By Lanney Gins, CMA

## 2015-12-16 ENCOUNTER — Ambulatory Visit: Payer: No Typology Code available for payment source

## 2015-12-19 ENCOUNTER — Ambulatory Visit (INDEPENDENT_AMBULATORY_CARE_PROVIDER_SITE_OTHER): Payer: Self-pay

## 2015-12-19 VITALS — BP 120/74 | HR 84 | Temp 98.3°F

## 2015-12-19 DIAGNOSIS — Z3042 Encounter for surveillance of injectable contraceptive: Secondary | ICD-10-CM

## 2015-12-19 DIAGNOSIS — Z308 Encounter for other contraceptive management: Secondary | ICD-10-CM

## 2015-12-19 MED ORDER — MEDROXYPROGESTERONE ACETATE 150 MG/ML IM SUSP
150.0000 mg | Freq: Once | INTRAMUSCULAR | Status: AC
  Administered 2015-12-19: 150 mg via INTRAMUSCULAR

## 2015-12-19 NOTE — Progress Notes (Signed)
Patient in office today for routine Depo injection. Pt. Tolerated well, given in left hip. Pt. RTO on 03/11/2016 for next injection.

## 2015-12-21 ENCOUNTER — Emergency Department (HOSPITAL_COMMUNITY)
Admission: EM | Admit: 2015-12-21 | Discharge: 2015-12-21 | Disposition: A | Discharge status: Home / Self Care | Payer: BLUE CROSS/BLUE SHIELD | Attending: Emergency Medicine | Admitting: Emergency Medicine

## 2015-12-21 ENCOUNTER — Encounter (HOSPITAL_COMMUNITY): Payer: Self-pay | Admitting: Emergency Medicine

## 2015-12-21 ENCOUNTER — Emergency Department (HOSPITAL_COMMUNITY): Payer: BLUE CROSS/BLUE SHIELD

## 2015-12-21 DIAGNOSIS — R1084 Generalized abdominal pain: Secondary | ICD-10-CM | POA: Diagnosis present

## 2015-12-21 DIAGNOSIS — K529 Noninfective gastroenteritis and colitis, unspecified: Secondary | ICD-10-CM | POA: Diagnosis not present

## 2015-12-21 DIAGNOSIS — R109 Unspecified abdominal pain: Secondary | ICD-10-CM | POA: Diagnosis not present

## 2015-12-21 LAB — CBC
HEMATOCRIT: 37 % (ref 36.0–46.0)
Hemoglobin: 12.6 g/dL (ref 12.0–15.0)
MCH: 30.3 pg (ref 26.0–34.0)
MCHC: 34.1 g/dL (ref 30.0–36.0)
MCV: 88.9 fL (ref 78.0–100.0)
Platelets: 327 10*3/uL (ref 150–400)
RBC: 4.16 MIL/uL (ref 3.87–5.11)
RDW: 12.2 % (ref 11.5–15.5)
WBC: 9.6 10*3/uL (ref 4.0–10.5)

## 2015-12-21 LAB — URINALYSIS, ROUTINE W REFLEX MICROSCOPIC
BILIRUBIN URINE: NEGATIVE
GLUCOSE, UA: NEGATIVE mg/dL
HGB URINE DIPSTICK: NEGATIVE
KETONES UR: NEGATIVE mg/dL
NITRITE: NEGATIVE
PH: 7 (ref 5.0–8.0)
Protein, ur: NEGATIVE mg/dL
SPECIFIC GRAVITY, URINE: 1.019 (ref 1.005–1.030)

## 2015-12-21 LAB — COMPREHENSIVE METABOLIC PANEL
ALBUMIN: 4.1 g/dL (ref 3.5–5.0)
ALK PHOS: 62 U/L (ref 38–126)
ALT: 16 U/L (ref 14–54)
AST: 29 U/L (ref 15–41)
Anion gap: 6 (ref 5–15)
BILIRUBIN TOTAL: 0.8 mg/dL (ref 0.3–1.2)
BUN: 9 mg/dL (ref 6–20)
CO2: 24 mmol/L (ref 22–32)
Calcium: 9.5 mg/dL (ref 8.9–10.3)
Chloride: 111 mmol/L (ref 101–111)
Creatinine, Ser: 0.94 mg/dL (ref 0.44–1.00)
GFR calc Af Amer: >60 mL/min (ref >60)
GFR calc non Af Amer: >60 mL/min (ref >60)
GLUCOSE: 88 mg/dL (ref 65–99)
POTASSIUM: 3.6 mmol/L (ref 3.5–5.1)
SODIUM: 141 mmol/L (ref 135–145)
TOTAL PROTEIN: 6.6 g/dL (ref 6.5–8.1)

## 2015-12-21 LAB — URINE MICROSCOPIC-ADD ON

## 2015-12-21 LAB — LIPASE, BLOOD: Lipase: 23 U/L (ref 11–51)

## 2015-12-21 LAB — POC URINE PREG, ED: Preg Test, Ur: NEGATIVE

## 2015-12-21 MED ORDER — DICYCLOMINE HCL 10 MG PO CAPS
10.0000 mg | ORAL_CAPSULE | Freq: Once | ORAL | Status: AC
  Administered 2015-12-21: 10 mg via ORAL
  Filled 2015-12-21: qty 1

## 2015-12-21 MED ORDER — ONDANSETRON HCL 4 MG/2ML IJ SOLN
4.0000 mg | Freq: Once | INTRAMUSCULAR | Status: AC
  Administered 2015-12-21: 4 mg via INTRAVENOUS
  Filled 2015-12-21: qty 2

## 2015-12-21 MED ORDER — KETOROLAC TROMETHAMINE 30 MG/ML IJ SOLN
30.0000 mg | Freq: Once | INTRAMUSCULAR | Status: AC
  Administered 2015-12-21: 30 mg via INTRAVENOUS
  Filled 2015-12-21: qty 1

## 2015-12-21 MED ORDER — SODIUM CHLORIDE 0.9 % IV BOLUS (SEPSIS)
1000.0000 mL | Freq: Once | INTRAVENOUS | Status: AC
  Administered 2015-12-21: 1000 mL via INTRAVENOUS

## 2015-12-21 MED ORDER — ONDANSETRON 4 MG PO TBDP: 4.0000 mg | ORAL_TABLET | Freq: Three times a day (TID) | ORAL | 0 refills | Status: DC | PRN

## 2015-12-21 NOTE — ED Notes (Signed)
Patient verbalizes understanding of discharge instructions, prescriptions, home care and follow up care. Patient out of department at this time. 

## 2015-12-21 NOTE — ED Notes (Signed)
Patient tolerated PO fluids

## 2015-12-21 NOTE — ED Notes (Signed)
Patient ambulatory to and from restroom without deficit 

## 2015-12-21 NOTE — ED Triage Notes (Signed)
Patient reports mid/epigastric pain and cramping with emesis and diarrhea onset today , denies fever or chills .

## 2015-12-21 NOTE — ED Provider Notes (Signed)
[facility]-EMERGENCY DEPT Provider Note   CSN: [id number] Arrival date & time: 12/21/15  0155  By signing my name below, I, [name], attest that this documentation has been prepared under the direction and in the presence of Shon Baton, MD  Electronically Signed: [name], ED Scribe. 12/21/15. 3:21 AM.  History   Chief Complaint Chief Complaint  Patient presents with  . Abdominal Pain   The history is provided by the patient. No language interpreter was used.   HPI Comments:  Katie Bowers is a 25 y.o. female who presents to the Emergency Department complaining of constant "10/10" sharp diffuse, abdominal pain which began at 8 PM 1 day ago. Associated symptoms include vomiting and diarrhea. She denies fevers, cough, SOB, rhinorrhea, dysuria, and vaginal bleeding. No exacerbating factors notes. No alleviating factors noted. Pt denies any other complaints at this time.  Past Medical History:  Diagnosis Date  . ASCUS (atypical squamous cells of undetermined significance) on Pap smear 12/2009   with high grade HPV  . Chlamydia 06/2009, 12/2009  . Grave's disease 06/2010   sees Dr. Margaretmary Bayley  . 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 Pure hypercholesterolemia 02/12/2011  . Grave's disease 02/05/2011    History reviewed. No pertinent surgical history.  OB History    No data available       Home Medications    Prior to Admission medications   Medication Sig Start Date End Date Taking? Authorizing Provider  medroxyPROGESTERone (DEPO-PROVERA) 150 MG/ML injection Inject 1 mL (150 mg total) into the muscle See admin instructions. Patient not taking: Reported on 12/21/2015 04/05/15   Brock Bad, MD  ondansetron (ZOFRAN ODT) 4 MG disintegrating tablet Take 1 tablet (4 mg total) by mouth every 8 (eight) hours as needed for nausea or vomiting. 12/21/15   Shon Baton, MD    valACYclovir (VALTREX) 500 MG tablet For initial diagnosis use 2 tablets twice daily for 10 days. For episodic outbreaks use 1 tablet twice daily for 3 days. Patient not taking: Reported on 12/21/2015 02/07/15   Brock Bad, MD    Family History Family History  Problem Relation Age of Onset  . Hyperlipidemia Mother   . Hypothyroidism Mother   . Cancer Maternal Uncle     lung cancer (smoker)  . Hyperthyroidism Paternal Aunt   . Hypertension Maternal Grandmother   . Hypothyroidism Paternal Grandmother     Social History Social History  Substance Use Topics  . Smoking status: Never Smoker  . Smokeless tobacco: Never Used  . Alcohol use 0.0 oz/week     Comment: 2 drinks every other month.     Allergies   Methimazole [methimazole]   Review of Systems Review of Systems  Constitutional: Negative for fever.  HENT: Negative for rhinorrhea.   Respiratory: Negative for cough and shortness of breath.   Gastrointestinal: Positive for abdominal pain, diarrhea, nausea and vomiting.  Genitourinary: Negative for dysuria and vaginal bleeding.  All other systems reviewed and are negative.    Physical Exam Updated Vital Signs BP 106/76   Pulse 99   Temp 98.7 F (37.1 C) (Oral)   Resp 22   SpO2 100%   Physical Exam  Constitutional: She is oriented to person, place, and time. She appears well-developed and well-nourished.  Uncomfortable appearing  HENT:  Head: Normocephalic and atraumatic.  Mucous membranes moist  Cardiovascular: Normal rate, regular rhythm and  normal heart sounds.   Pulmonary/Chest: Effort normal. No respiratory distress. She has no wheezes.  Abdominal: Soft. Bowel sounds are normal. She exhibits no mass. There is tenderness. There is no guarding.  Mild diffuse tenderness to palpation, no rebound or guarding, no point tenderness  Neurological: She is alert and oriented to person, place, and time.  Skin: Skin is warm and dry.  Psychiatric: She has a  normal mood and affect.  Nursing note and vitals reviewed.    ED Treatments / Results  DIAGNOSTIC STUDIES:  Oxygen Saturation is 100% on RA, normal by my interpretation.    COORDINATION OF CARE:  3:15 AM Discussed treatment plan with pt at bedside and pt agreed to plan.  Labs (all labs ordered are listed, but only abnormal results are displayed) Labs Reviewed  URINALYSIS, ROUTINE W REFLEX MICROSCOPIC (NOT AT [facility]) - Abnormal; Notable for the following:       Result Value   Leukocytes, UA SMALL (*)    All other components within normal limits  URINE MICROSCOPIC-ADD ON - Abnormal; Notable for the following:    Squamous Epithelial / LPF 6-30 (*)    Bacteria, UA RARE (*)    All other components within normal limits  LIPASE, BLOOD  COMPREHENSIVE METABOLIC PANEL  CBC  POC URINE PREG, ED    EKG  EKG Interpretation None       Radiology Dg Abdomen Acute W/chest  Result Date: 12/21/2015 CLINICAL DATA:  25 year old female with abdominal pain. EXAM: DG ABDOMEN ACUTE W/ 1V CHEST COMPARISON:  Chest radiograph 11/06/2014. FINDINGS: The lungs are clear. There is no pleural effusion or pneumothorax. The cardiac silhouette is within normal limits. Mildly thickened appearing jejunal folds may represent enteritis. Clinical correlation is recommended. No bowel dilatation or evidence of obstruction. No free air or radiopaque calculi. The soft tissues and osseous structures are grossly unremarkable. IMPRESSION: No acute cardiopulmonary process. No bowel obstruction. Findings may represent enteritis. Clinical correlation is recommended. Electronically Signed   By: Elgie Collard M.D.   On: 12/21/2015 05:41    Procedures Procedures (including critical care time)  Medications Ordered in ED Medications  sodium chloride 0.9 % bolus 1,000 mL (0 mLs Intravenous Stopped 12/21/15 0508)  dicyclomine (BENTYL) capsule 10 mg (10 mg Oral Given 12/21/15 0330)  ondansetron (ZOFRAN) injection 4 mg (4  mg Intravenous Given 12/21/15 0329)  ketorolac (TORADOL) 30 MG/ML injection 30 mg (30 mg Intravenous Given 12/21/15 0506)     Initial Impression / Assessment and Plan / ED Course  I have reviewed the triage vital signs and the nursing notes.  Pertinent labs & imaging results that were available during my care of the patient were reviewed by me and considered in my medical decision making (see chart for details).  Clinical Course    Patient presents with abdominal pain, vomiting, and diarrhea. She is nontoxic. No signs of peritonitis on exam. Associated vomiting and diarrhea. Vital signs stable. She is nontoxic-appearing. Lab work is within normal limits. Patient given fluids and Zofran. Suspect viral etiology. Patient was given Bentyl and Toradol for pain. Acute abdominal series showed evidence of likely enteritis. Patient improved on recheck. She is tolerating fluids. Will discharge home with supportive care.  After history, exam, and medical workup I feel the patient has been appropriately medically screened and is safe for discharge home. Pertinent diagnoses were discussed with the patient. Patient was given return precautions.  Final Clinical Impressions(s) / ED Diagnoses   Final diagnoses:  Gastroenteritis  New Prescriptions New Prescriptions   ONDANSETRON (ZOFRAN ODT) 4 MG DISINTEGRATING TABLET    Take 1 tablet (4 mg total) by mouth every 8 (eight) hours as needed for nausea or vomiting.   I personally performed the services described in this documentation, which was scribed in my presence. The recorded information has been reviewed and is accurate.     Shon Batonourtney F Ginger Leeth, MD 12/21/15 [phone]

## 2015-12-21 NOTE — ED Notes (Signed)
Patient provided Sprite for PO fluid challenge

## 2016-02-02 ENCOUNTER — Ambulatory Visit (INDEPENDENT_AMBULATORY_CARE_PROVIDER_SITE_OTHER): Payer: BLUE CROSS/BLUE SHIELD | Admitting: Obstetrics

## 2016-02-02 ENCOUNTER — Encounter: Payer: Self-pay | Admitting: Obstetrics

## 2016-02-02 VITALS — BP 107/73 | HR 75 | Temp 98.8°F | Wt 135.3 lb

## 2016-02-02 DIAGNOSIS — Z124 Encounter for screening for malignant neoplasm of cervix: Secondary | ICD-10-CM

## 2016-02-02 DIAGNOSIS — Z01419 Encounter for gynecological examination (general) (routine) without abnormal findings: Secondary | ICD-10-CM

## 2016-02-02 DIAGNOSIS — Z3042 Encounter for surveillance of injectable contraceptive: Secondary | ICD-10-CM

## 2016-02-02 MED ORDER — MEDROXYPROGESTERONE ACETATE 150 MG/ML IM SUSP: 150.0000 mg | INTRAMUSCULAR | 3 refills | Status: DC

## 2016-02-02 NOTE — Progress Notes (Signed)
Subjective:        Katie Bowers is a 25 y.o. female here for a routine exam.  Current complaints: None.    Personal health questionnaire:  Is patient [name], have a family history of breast and/or ovarian cancer: no Is there a family history of uterine cancer diagnosed at age < 3650, gastrointestinal cancer, urinary tract cancer, family member who is a Personnel officerLynch syndrome-associated carrier: no Is the patient overweight and hypertensive, family history of diabetes, personal history of gestational diabetes, preeclampsia or PCOS: no Is patient over 8355, have PCOS,  family history of premature CHD under age 25, diabetes, smoke, have hypertension or peripheral artery disease:  no At any time, has a partner hit, kicked or otherwise hurt or frightened you?: no Over the past 2 weeks, have you felt down, depressed or hopeless?: no Over the past 2 weeks, have you felt little interest or pleasure in doing things?:no   Gynecologic History No LMP recorded. Patient has had an injection. Contraception: Depo-Provera injections Last Pap: 2016. Results were: normal Last mammogram: n/a. Results were: n/a  Obstetric History OB History  No data available    Past Medical History:  Diagnosis Date  . ASCUS (atypical squamous cells of undetermined significance) on Pap smear 12/2009   with high grade HPV  . Chlamydia 06/2009, 12/2009  . Grave's disease 06/2010   sees Dr. Margaretmary BayleyPreston Clark  . 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 No pertinent surgical history.   Current Outpatient Prescriptions:  .  medroxyPROGESTERone (DEPO-PROVERA) 150 MG/ML injection, Inject 1 mL (150 mg total) into the muscle See admin instructions. (Patient not taking: Reported on 12/21/2015), Disp: 1 mL, Rfl: 3 .  ondansetron (ZOFRAN ODT) 4 MG disintegrating tablet, Take 1 tablet (4 mg total) by mouth every 8 (eight) hours as needed for nausea or vomiting., Disp: 20 tablet, Rfl: 0 .   valACYclovir (VALTREX) 500 MG tablet, For initial diagnosis use 2 tablets twice daily for 10 days. For episodic outbreaks use 1 tablet twice daily for 3 days. (Patient not taking: Reported on 02/02/2016), Disp: 60 tablet, Rfl: 5 Allergies  Allergen Reactions  . Methimazole [Methimazole] Swelling    Face and tongue.     Social History  Substance Use Topics  . Smoking status: Never Smoker  . Smokeless tobacco: Never Used  . Alcohol use 0.0 oz/week     Comment: 2 drinks every other month.    Family History  Problem Relation Age of Onset  . Hyperlipidemia Mother   . Hypothyroidism Mother   . Cancer Maternal Uncle     lung cancer (smoker)  . Hyperthyroidism Paternal Aunt   . Hypertension Maternal Grandmother   . Hypothyroidism Paternal Grandmother       Review of Systems  Constitutional: negative for fatigue and weight loss Respiratory: negative for cough and wheezing Cardiovascular: negative for chest pain, fatigue and palpitations Gastrointestinal: negative for abdominal pain and change in bowel habits Musculoskeletal:negative for myalgias Neurological: negative for gait problems and tremors Behavioral/Psych: negative for abusive relationship, depression Endocrine: negative for temperature intolerance    Genitourinary:negative for abnormal menstrual periods, genital lesions, hot flashes, sexual problems and vaginal discharge Integument/breast: negative for breast lump, breast tenderness, nipple discharge and skin lesion(s)    Objective:       BP 107/73   Pulse 75   Temp 98.8 F (37.1 C) (Oral)   Wt 135 lb 4.8 oz (61.4 kg)   BMI 23.97 kg/m  General:   alert  Skin:   no rash or abnormalities  Lungs:   clear to auscultation bilaterally  Heart:   regular rate and rhythm, S1, S2 normal, no murmur, click, rub or gallop  Breasts:   normal without suspicious masses, skin or nipple changes or axillary nodes  Abdomen:  normal findings: no organomegaly, soft, non-tender and no  hernia  Pelvis:  External genitalia: normal general appearance Urinary system: urethral meatus normal and bladder without fullness, nontender Vaginal: normal without tenderness, induration or masses Cervix: normal appearance Adnexa: normal bimanual exam Uterus: anteverted and non-tender, normal size   Lab Review Urine pregnancy test Labs reviewed yes Radiologic studies reviewed no  50% of 20 min visit spent on counseling and coordination of care.    Assessment:    Healthy female exam.    Contraceptive Surveillance.  Pleased with Depo Provera.   Plan:    Education reviewed: calcium supplements, low fat, low cholesterol diet, safe sex/STD prevention, self breast exams and weight bearing exercise. Contraception: Depo-Provera injections. Follow up in: 1 year.   No orders of the defined types were placed in this encounter.  No orders of the defined types were placed in this encounter.     Patient ID: Katie Bowers, female   DOB: 10-24-1990, 25 y.o.   MRN: [id number]

## 2016-02-02 NOTE — Addendum Note (Signed)
Addended by: Francene FindersJAMES, Ayshia Gramlich C on: 02/02/2016 04:42 PM   Modules accepted: Orders

## 2016-02-07 ENCOUNTER — Other Ambulatory Visit: Payer: Self-pay | Admitting: Obstetrics

## 2016-02-07 DIAGNOSIS — B9689 Other specified bacterial agents as the cause of diseases classified elsewhere: Secondary | ICD-10-CM

## 2016-02-07 DIAGNOSIS — N760 Acute vaginitis: Principal | ICD-10-CM

## 2016-02-07 LAB — NUSWAB VG+, CANDIDA 6SP
CANDIDA KRUSEI, NAA: NEGATIVE
CANDIDA LUSITANIAE, NAA: NEGATIVE
CHLAMYDIA TRACHOMATIS, NAA: NEGATIVE
Candida albicans, NAA: NEGATIVE
Candida glabrata, NAA: NEGATIVE
Candida parapsilosis, NAA: NEGATIVE
Candida tropicalis, NAA: NEGATIVE
Megasphaera 1: HIGH Score — AB
NEISSERIA GONORRHOEAE, NAA: NEGATIVE
Trich vag by NAA: NEGATIVE

## 2016-02-07 LAB — CYTOLOGY - PAP: Diagnosis: NEGATIVE

## 2016-02-07 MED ORDER — METRONIDAZOLE 500 MG PO TABS: 500.0000 mg | ORAL_TABLET | Freq: Two times a day (BID) | ORAL | 2 refills | Status: DC

## 2016-03-09 ENCOUNTER — Ambulatory Visit (INDEPENDENT_AMBULATORY_CARE_PROVIDER_SITE_OTHER): Payer: BLUE CROSS/BLUE SHIELD | Admitting: *Deleted

## 2016-03-09 VITALS — BP 109/75 | HR 96 | Wt 135.2 lb

## 2016-03-09 DIAGNOSIS — Z3042 Encounter for surveillance of injectable contraceptive: Secondary | ICD-10-CM

## 2016-03-09 MED ORDER — MEDROXYPROGESTERONE ACETATE 150 MG/ML IM SUSP
150.0000 mg | Freq: Once | INTRAMUSCULAR | Status: AC
  Administered 2016-03-09: 150 mg via INTRAMUSCULAR

## 2016-06-01 ENCOUNTER — Ambulatory Visit: Payer: BLUE CROSS/BLUE SHIELD

## 2016-06-08 ENCOUNTER — Ambulatory Visit: Payer: BLUE CROSS/BLUE SHIELD

## 2016-06-14 ENCOUNTER — Ambulatory Visit (INDEPENDENT_AMBULATORY_CARE_PROVIDER_SITE_OTHER): Payer: BLUE CROSS/BLUE SHIELD

## 2016-06-14 DIAGNOSIS — Z3202 Encounter for pregnancy test, result negative: Secondary | ICD-10-CM | POA: Diagnosis not present

## 2016-06-14 DIAGNOSIS — Z3042 Encounter for surveillance of injectable contraceptive: Secondary | ICD-10-CM | POA: Diagnosis not present

## 2016-06-14 LAB — POCT URINE PREGNANCY: PREG TEST UR: NEGATIVE

## 2016-06-14 MED ORDER — MEDROXYPROGESTERONE ACETATE 150 MG/ML IM SUSP
150.0000 mg | Freq: Once | INTRAMUSCULAR | Status: AC
  Administered 2016-06-14: 150 mg via INTRAMUSCULAR

## 2016-06-14 NOTE — Progress Notes (Signed)
Patient is in the office for Depo restart, UPT negative. Administered and patient tolerated well.. Administrations This Visit    medroxyPROGESTERone (DEPO-PROVERA) injection 150 mg    Admin Date 06/14/2016 Action Given Dose 150 mg Route Intramuscular Administered By Katrina Stack, RN

## 2016-08-31 ENCOUNTER — Encounter: Payer: Self-pay | Admitting: Obstetrics

## 2016-08-31 ENCOUNTER — Encounter: Payer: Self-pay | Admitting: *Deleted

## 2016-08-31 ENCOUNTER — Ambulatory Visit: Payer: BLUE CROSS/BLUE SHIELD

## 2016-08-31 ENCOUNTER — Ambulatory Visit (INDEPENDENT_AMBULATORY_CARE_PROVIDER_SITE_OTHER): Payer: BLUE CROSS/BLUE SHIELD | Admitting: Obstetrics

## 2016-08-31 VITALS — BP 128/80 | HR 89 | Ht 64.0 in | Wt 134.0 lb

## 2016-08-31 DIAGNOSIS — Z3202 Encounter for pregnancy test, result negative: Secondary | ICD-10-CM

## 2016-08-31 DIAGNOSIS — Z30430 Encounter for insertion of intrauterine contraceptive device: Secondary | ICD-10-CM | POA: Diagnosis not present

## 2016-08-31 DIAGNOSIS — N898 Other specified noninflammatory disorders of vagina: Secondary | ICD-10-CM | POA: Diagnosis not present

## 2016-08-31 DIAGNOSIS — Z113 Encounter for screening for infections with a predominantly sexual mode of transmission: Secondary | ICD-10-CM

## 2016-08-31 LAB — POCT URINE PREGNANCY: Preg Test, Ur: NEGATIVE

## 2016-08-31 MED ORDER — LEVONORGESTREL 20 MCG/24HR IU IUD
INTRAUTERINE_SYSTEM | Freq: Once | INTRAUTERINE | Status: AC
  Administered 2016-08-31: 11:00:00 via INTRAUTERINE

## 2016-08-31 NOTE — Progress Notes (Signed)
IUD Insertion Procedure Note  Pre-operative Diagnosis: Desires Long Term Reversible Contraception ( LARC )  Post-operative Diagnosis: same  Indications: contraception  Procedure Details  Urine pregnancy test was done in office and result was negative.  The risks (including infection, bleeding, pain, and uterine perforation) and benefits of the procedure were explained to the patient and Written informed consent was obtained.    Cervix cleansed with Betadine. Uterus sounded to 8 cm. IUD inserted without difficulty. String visible and trimmed. Patient tolerated procedure well.  IUD Information: Mirena, Lot # [id number], Expiration date later.  Condition: Stable  Complications: None  Plan:  The patient was advised to call for any fever or for prolonged or severe pain or bleeding. She was advised to use NSAID as needed for mild to moderate pain.   Attending Physician Documentation: I was present for or participated in the entire procedure, including opening and closing.

## 2016-08-31 NOTE — Progress Notes (Signed)
Pt presents for birth control options. Pt wants IUD; unsure which one. She is due to get Depo injection this week. Last pap Normal 02/02/16.

## 2016-09-03 LAB — CERVICOVAGINAL ANCILLARY ONLY
BACTERIAL VAGINITIS: POSITIVE — AB
CHLAMYDIA, DNA PROBE: NEGATIVE
Candida vaginitis: NEGATIVE
Neisseria Gonorrhea: NEGATIVE
Trichomonas: NEGATIVE

## 2016-09-04 ENCOUNTER — Other Ambulatory Visit: Payer: Self-pay | Admitting: Obstetrics

## 2016-09-04 DIAGNOSIS — B9689 Other specified bacterial agents as the cause of diseases classified elsewhere: Secondary | ICD-10-CM

## 2016-09-04 DIAGNOSIS — N760 Acute vaginitis: Principal | ICD-10-CM

## 2016-09-04 MED ORDER — METRONIDAZOLE 500 MG PO TABS: 500.0000 mg | ORAL_TABLET | Freq: Two times a day (BID) | ORAL | 2 refills | Status: DC

## 2016-09-04 NOTE — Progress Notes (Signed)
Patient notified of results and Rx. 

## 2016-10-12 ENCOUNTER — Ambulatory Visit (INDEPENDENT_AMBULATORY_CARE_PROVIDER_SITE_OTHER): Payer: BLUE CROSS/BLUE SHIELD | Admitting: Obstetrics

## 2016-10-12 ENCOUNTER — Encounter: Payer: Self-pay | Admitting: Obstetrics

## 2016-10-12 VITALS — BP 103/69 | HR 81 | Wt 130.0 lb

## 2016-10-12 DIAGNOSIS — Z30431 Encounter for routine checking of intrauterine contraceptive device: Secondary | ICD-10-CM | POA: Diagnosis not present

## 2016-10-12 NOTE — Progress Notes (Signed)
Pt denies any complaints with IUD.

## 2016-10-12 NOTE — Progress Notes (Signed)
Subjective:    Katie Bowers  who presents for check up after IUD Insertion. The patient has no complaints today. The patient is sexually active. Pertinent past medical history: none.  Likes her Mirena IUD.  Is not currently bleeding with the IUD.    The information documented in the HPI was reviewed and verified.  Menstrual History: OB History    Gravida Para Term Preterm AB Living   1 1 1     1    SAB TAB Ectopic Multiple Live Births         0 1      No LMP recorded. has Mirena IUD   Patient Active Problem List   Diagnosis Date Noted   Patient Active Problem List   Diagnosis Date Noted  . Pure hypercholesterolemia 02/12/2011  . Grave's disease 02/05/2011    No past medical history on file.  Past Surgical History:  Procedure Laterality Date  History reviewed. No pertinent surgical history.    Current Outpatient Prescriptions:  No medication comments found. Current Outpatient Prescriptions on File Prior to Visit  Medication Sig Dispense Refill  . valACYclovir (VALTREX) 500 MG tablet For initial diagnosis use 2 tablets twice daily for 10 days. For episodic outbreaks use 1 tablet twice daily for 3 days. (Patient not taking: Reported on 03/09/2016) 60 tablet 5   No current facility-administered medications on file prior to visit.     Allergies  Allergen Reactions   Allergies  Allergen Reactions  . Methimazole [Methimazole] Swelling    Face and tongue.      Social History  Substance Use Topics   Social History   Social History  . Marital status: Single    Spouse name: N/A  . Number of children: 0  . Years of education: N/A   Occupational History  . Not on file.   Social History Main Topics  . Smoking status: Never Smoker  . Smokeless tobacco: Never Used  . Alcohol use 0.0 oz/week     Comment: 2 drinks every other month.  . Drug use: No  . Sexual activity: Yes    Partners: Male    Birth control/ protection: IUD   Other Topics Concern  . Not on file    Social History Narrative   Lives with her mother, no pets.  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     Review of Systems Constitutional: negative for weight loss Genitourinary:negative for abnormal menstrual periods and vaginal discharge   Objective:   BP 115/77   Pulse 89   Wt 161 lb (73 kg)   BMI 25.22 kg/m    General:   alert  Skin:   no rash or abnormalities  Lungs:   clear to auscultation bilaterally  Heart:   regular rate and rhythm, S1, S2 normal, no murmur, click, rub or gallop  Breasts:   deferred  Abdomen:  normal findings: no organomegaly, soft, non-tender and no hernia  Pelvis:  External genitalia: normal general appearance Urinary system: urethral meatus normal and bladder without fullness, nontender Vaginal: normal without tenderness, induration or masses Cervix: normal appearance, IUD strings present Adnexa: normal bimanual exam Uterus: anteverted and non-tender, normal size   Lab Review Urine pregnancy test Labs reviewed yes Radiologic studies reviewed no  50% of 15 min visit spent on counseling and coordination of care.    Assessment:    26 y.o., continuing IUD, no contraindications.  Plan:    All questions answered.  No orders of the defined types were placed in this encounter.  No orders of the defined types were placed in this encounter.  Follow up as needed or in 4 months for annual exam.

## 2017-02-04 ENCOUNTER — Ambulatory Visit: Payer: BLUE CROSS/BLUE SHIELD | Admitting: Obstetrics

## 2017-02-07 ENCOUNTER — Ambulatory Visit (INDEPENDENT_AMBULATORY_CARE_PROVIDER_SITE_OTHER): Payer: BLUE CROSS/BLUE SHIELD | Admitting: Obstetrics

## 2017-02-07 ENCOUNTER — Encounter: Payer: Self-pay | Admitting: Obstetrics

## 2017-02-07 DIAGNOSIS — Z01419 Encounter for gynecological examination (general) (routine) without abnormal findings: Secondary | ICD-10-CM

## 2017-02-07 DIAGNOSIS — Z124 Encounter for screening for malignant neoplasm of cervix: Secondary | ICD-10-CM | POA: Diagnosis not present

## 2017-02-07 DIAGNOSIS — N760 Acute vaginitis: Secondary | ICD-10-CM

## 2017-02-07 DIAGNOSIS — B9689 Other specified bacterial agents as the cause of diseases classified elsewhere: Secondary | ICD-10-CM

## 2017-02-07 DIAGNOSIS — Z113 Encounter for screening for infections with a predominantly sexual mode of transmission: Secondary | ICD-10-CM

## 2017-02-07 DIAGNOSIS — Z30431 Encounter for routine checking of intrauterine contraceptive device: Secondary | ICD-10-CM

## 2017-02-07 MED ORDER — METRONIDAZOLE 500 MG PO TABS: 500.0000 mg | ORAL_TABLET | Freq: Two times a day (BID) | ORAL | 5 refills | Status: DC

## 2017-02-07 NOTE — Progress Notes (Signed)
Subjective:        Katie Bowers is a 26 y.o. female here for a routine exam.  Current complaints: Recurrent BV.Marland Kitchen.    Personal health questionnaire:  Is patient [name], have a family history of breast and/or ovarian cancer: no Is there a family history of uterine cancer diagnosed at age < 7650, gastrointestinal cancer, urinary tract cancer, family member who is a Personnel officerLynch syndrome-associated carrier: no Is the patient overweight and hypertensive, family history of diabetes, personal history of gestational diabetes, preeclampsia or PCOS: no Is patient over 6755, have PCOS,  family history of premature CHD under age 26, diabetes, smoke, have hypertension or peripheral artery disease:  no At any time, has a partner hit, kicked or otherwise hurt or frightened you?: no Over the past 2 weeks, have you felt down, depressed or hopeless?: no Over the past 2 weeks, have you felt little interest or pleasure in doing things?:no   Gynecologic History No LMP recorded. Patient is not currently having periods (Reason: IUD). Contraception: IUD Last Pap: 2008. Results were: normal Last mammogram: n/a. Results were: n/a  Obstetric History OB History  Gravida Para Term Preterm AB Living  1       1    SAB TAB Ectopic Multiple Live Births    1          # Outcome Date GA Lbr Len/2nd Weight Sex Delivery Anes PTL Lv  1 TAB 2010              Past Medical History:  Diagnosis Date  . ASCUS (atypical squamous cells of undetermined significance) on Pap smear 12/2009   with high grade HPV  . Chlamydia 06/2009, 12/2009  . Grave's disease 06/2010   sees Dr. Margaretmary BayleyPreston Clark  . Hyperthyroidism   . 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 No pertinent surgical history.   Current Outpatient Medications:  .  valACYclovir (VALTREX) 500 MG tablet, For initial diagnosis use 2 tablets twice daily for 10 days. For episodic outbreaks use 1 tablet twice daily for 3  days., Disp: 60 tablet, Rfl: 5 .  metroNIDAZOLE (FLAGYL) 500 MG tablet, Take 1 tablet (500 mg total) by mouth 2 (two) times daily. Take medication monthly x 6 months, for chronic BV., Disp: 14 tablet, Rfl: 5 Allergies  Allergen Reactions  . Methimazole [Methimazole] Swelling    Face and tongue.     Social History   Tobacco Use  . Smoking status: Never Smoker  . Smokeless tobacco: Never Used  Substance Use Topics  . Alcohol use: Yes    Alcohol/week: 0.0 oz    Comment: 2 drinks every other month.    Family History  Problem Relation Age of Onset  . Hyperlipidemia Mother   . Hypothyroidism Mother   . Cancer Maternal Uncle        lung cancer (smoker)  . Hyperthyroidism Paternal Aunt   . Hypertension Maternal Grandmother   . Hypothyroidism Paternal Grandmother       Review of Systems  Constitutional: negative for fatigue and weight loss Respiratory: negative for cough and wheezing Cardiovascular: negative for chest pain, fatigue and palpitations Gastrointestinal: negative for abdominal pain and change in bowel habits Musculoskeletal:negative for myalgias Neurological: negative for gait problems and tremors Behavioral/Psych: negative for abusive relationship, depression Endocrine: negative for temperature intolerance    Genitourinary:negative for abnormal menstrual periods, genital lesions, hot flashes, sexual problems and vaginal discharge Integument/breast: negative for breast lump, breast tenderness,  nipple discharge and skin lesion(s)    Objective:       There were no vitals taken for this visit. General:   alert  Skin:   no rash or abnormalities  Lungs:   clear to auscultation bilaterally  Heart:   regular rate and rhythm, S1, S2 normal, no murmur, click, rub or gallop  Breasts:   normal without suspicious masses, skin or nipple changes or axillary nodes  Abdomen:  normal findings: no organomegaly, soft, non-tender and no hernia  Pelvis:  External genitalia: normal  general appearance Urinary system: urethral meatus normal and bladder without fullness, nontender Vaginal: normal without tenderness, induration or masses Cervix: normal appearance Adnexa: normal bimanual exam Uterus: anteverted and non-tender, normal size   Lab Review Urine pregnancy test Labs reviewed yes Radiologic studies reviewed no  50% of 20 min visit spent on counseling and coordination of care.   Assessment:     1. Encounter for routine gynecological examination with Papanicolaou smear of cervix Rx: - Cytology - PAP  2. BV (bacterial vaginosis) Rx: - Cervicovaginal ancillary only - metroNIDAZOLE (FLAGYL) 500 MG tablet; Take 1 tablet (500 mg total) by mouth 2 (two) times daily. Take medication monthly x 6 months, for chronic BV.  Dispense: 14 tablet; Refill: 5  3. Encounter for routine checking of intrauterine contraceptive device (IUD) - doing well   Plan:    Education reviewed: calcium supplements, depression evaluation, low fat, low cholesterol diet, safe sex/STD prevention, self breast exams and weight bearing exercise. Contraception: IUD. Follow up in: 1 year.   Meds ordered this encounter  Medications  . metroNIDAZOLE (FLAGYL) 500 MG tablet    Sig: Take 1 tablet (500 mg total) by mouth 2 (two) times daily. Take medication monthly x 6 months, for chronic BV.    Dispense:  14 tablet    Refill:  5   No orders of the defined types were placed in this encounter.

## 2017-02-08 LAB — CERVICOVAGINAL ANCILLARY ONLY
Bacterial vaginitis: POSITIVE — AB
CHLAMYDIA, DNA PROBE: NEGATIVE
Candida vaginitis: NEGATIVE
NEISSERIA GONORRHEA: NEGATIVE
Trichomonas: NEGATIVE

## 2017-02-11 LAB — CYTOLOGY - PAP: DIAGNOSIS: NEGATIVE

## 2017-08-09 ENCOUNTER — Encounter: Payer: Self-pay | Admitting: Obstetrics

## 2017-08-26 ENCOUNTER — Encounter: Payer: Self-pay | Admitting: Obstetrics

## 2017-08-26 ENCOUNTER — Encounter: Payer: Self-pay | Admitting: *Deleted

## 2017-08-26 ENCOUNTER — Ambulatory Visit: Payer: BLUE CROSS/BLUE SHIELD | Admitting: Obstetrics

## 2017-08-26 VITALS — BP 100/71 | HR 75 | Wt 124.0 lb

## 2017-08-26 DIAGNOSIS — Z113 Encounter for screening for infections with a predominantly sexual mode of transmission: Secondary | ICD-10-CM | POA: Diagnosis not present

## 2017-08-26 DIAGNOSIS — B9689 Other specified bacterial agents as the cause of diseases classified elsewhere: Secondary | ICD-10-CM | POA: Diagnosis not present

## 2017-08-26 DIAGNOSIS — Z30432 Encounter for removal of intrauterine contraceptive device: Secondary | ICD-10-CM

## 2017-08-26 DIAGNOSIS — Z3042 Encounter for surveillance of injectable contraceptive: Secondary | ICD-10-CM | POA: Diagnosis not present

## 2017-08-26 DIAGNOSIS — Z3009 Encounter for other general counseling and advice on contraception: Secondary | ICD-10-CM

## 2017-08-26 DIAGNOSIS — N760 Acute vaginitis: Secondary | ICD-10-CM

## 2017-08-26 DIAGNOSIS — Z30013 Encounter for initial prescription of injectable contraceptive: Secondary | ICD-10-CM

## 2017-08-26 MED ORDER — MEDROXYPROGESTERONE ACETATE 150 MG/ML IM SUSP
150.0000 mg | Freq: Once | INTRAMUSCULAR | Status: AC
  Administered 2017-08-26: 150 mg via INTRAMUSCULAR

## 2017-08-26 MED ORDER — MEDROXYPROGESTERONE ACETATE 150 MG/ML IM SUSP: 150.0000 mg | INTRAMUSCULAR | 4 refills | Status: DC

## 2017-08-26 MED ORDER — METRONIDAZOLE 500 MG PO TABS: 500.0000 mg | ORAL_TABLET | Freq: Two times a day (BID) | ORAL | 5 refills | Status: DC

## 2017-08-26 NOTE — Addendum Note (Signed)
Addended by: Maretta BeesMCGLASHAN, Aharon Carriere J on: 08/26/2017 01:23 PM   Modules accepted: Orders

## 2017-08-26 NOTE — Progress Notes (Signed)
RGYN patient presents for IUD Removal.  IUD Inserted on : 09/01/2046  CC: Heavy periods that are monthly.   Pt wants to return to Depo Injections.

## 2017-08-26 NOTE — Progress Notes (Signed)
Depo given in LUOQ, tolerated well.   Next DEPO 9/16-30/2019  Administrations This Visit    medroxyPROGESTERone (DEPO-PROVERA) injection 150 mg    Admin Date 08/26/2017 Action Given Dose 150 mg Route Intramuscular Administered By Maretta BeesMcGlashan, Vahe Pienta J, RMA

## 2017-08-26 NOTE — Addendum Note (Signed)
Addended by: Tim LairLARK, Cheril Slattery on: 08/26/2017 04:16 PM   Modules accepted: Orders

## 2017-08-26 NOTE — Progress Notes (Signed)
    GYNECOLOGY OFFICE PROCEDURE NOTE  Katie Bowers is a 27 y.o. G1P0010 here for Mirena IUD removal. No GYN concerns.  Last pap smeaGeanie Loganr was on 02-07-2017 and was normal.  IUD Removal  Patient identified, informed consent performed, consent signed.  Patient was in the dorsal lithotomy position, normal external genitalia was noted.  A speculum was placed in the patient's vagina, normal discharge was noted, no lesions. The cervix was visualized, no lesions, no abnormal discharge.  The strings of the IUD were grasped and pulled using ring forceps. The IUD was removed in its entirety.   Patient tolerated the procedure well.    Patient will use Depo Provera Injecyions for contraception.  Routine preventative health maintenance measures emphasized.   Brock BadHARLES A. HARPER MD, MD, FACOG Obstetrician & Gynecologist, [facility], [facility]

## 2017-08-27 LAB — CERVICOVAGINAL ANCILLARY ONLY
BACTERIAL VAGINITIS: POSITIVE — AB
CHLAMYDIA, DNA PROBE: NEGATIVE
Candida vaginitis: NEGATIVE
NEISSERIA GONORRHEA: NEGATIVE
TRICH (WINDOWPATH): NEGATIVE

## 2017-08-28 ENCOUNTER — Other Ambulatory Visit: Payer: Self-pay | Admitting: Obstetrics

## 2017-11-21 ENCOUNTER — Ambulatory Visit: Payer: BLUE CROSS/BLUE SHIELD

## 2018-04-22 IMAGING — DX DG ABDOMEN ACUTE W/ 1V CHEST
3 series · 3 of 3 positions shown · non-contrast
Comparison: Chest radiograph 11/06/2014.

CLINICAL DATA: 25-year-old female with abdominal pain.

EXAM:
DG ABDOMEN ACUTE W/ 1V CHEST

[chest pa]
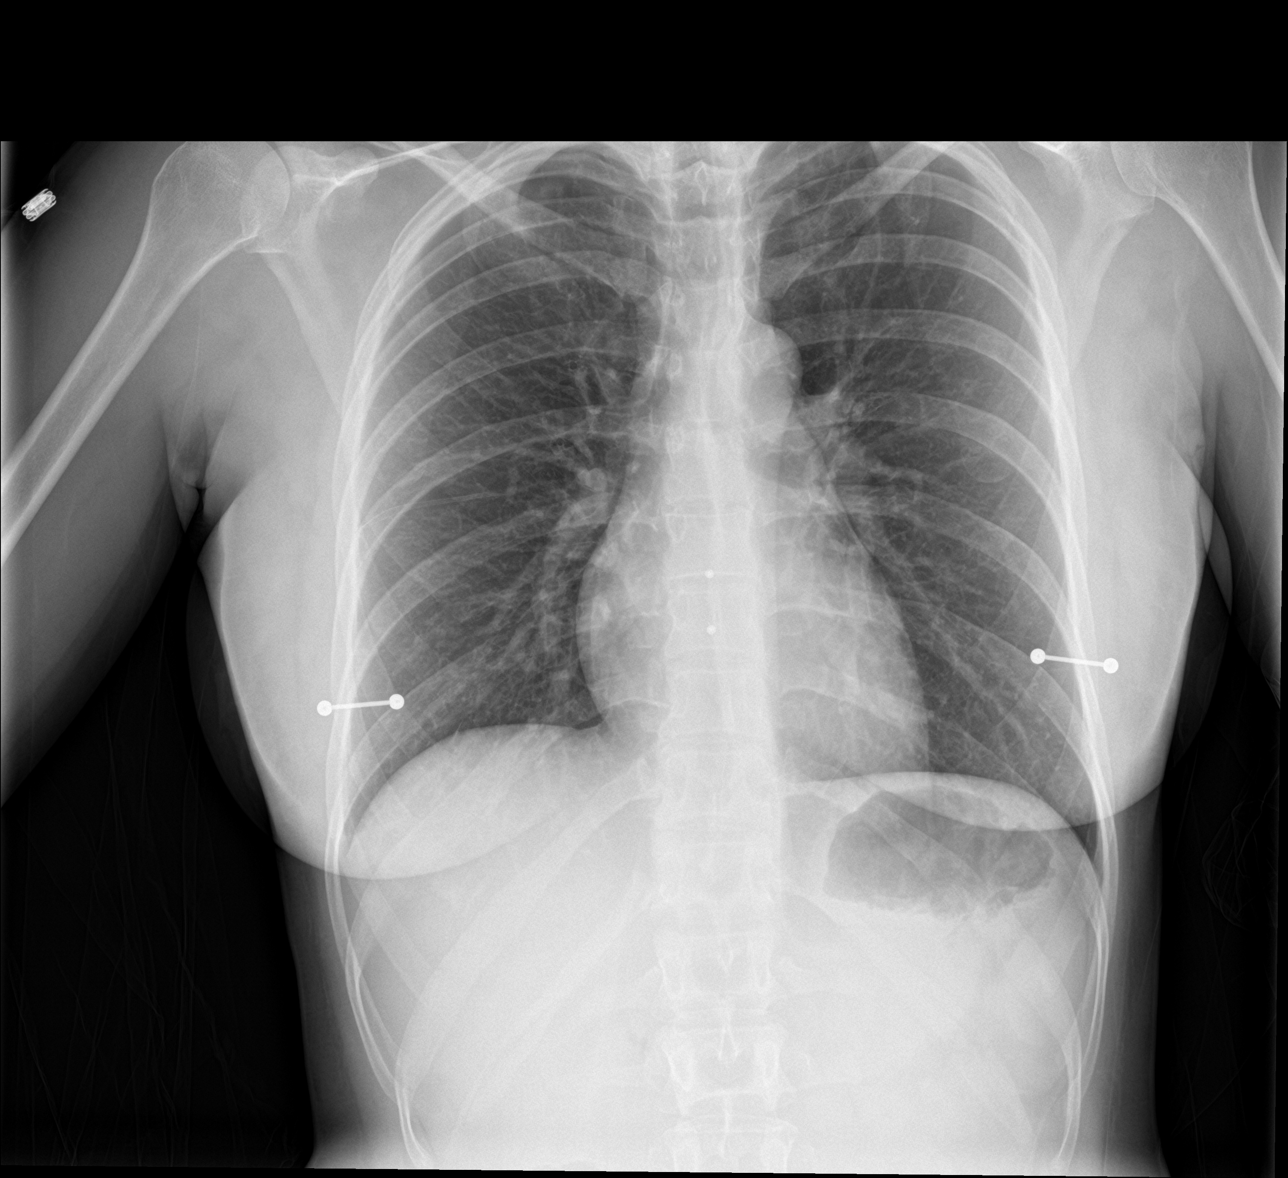

[abdomen erect]
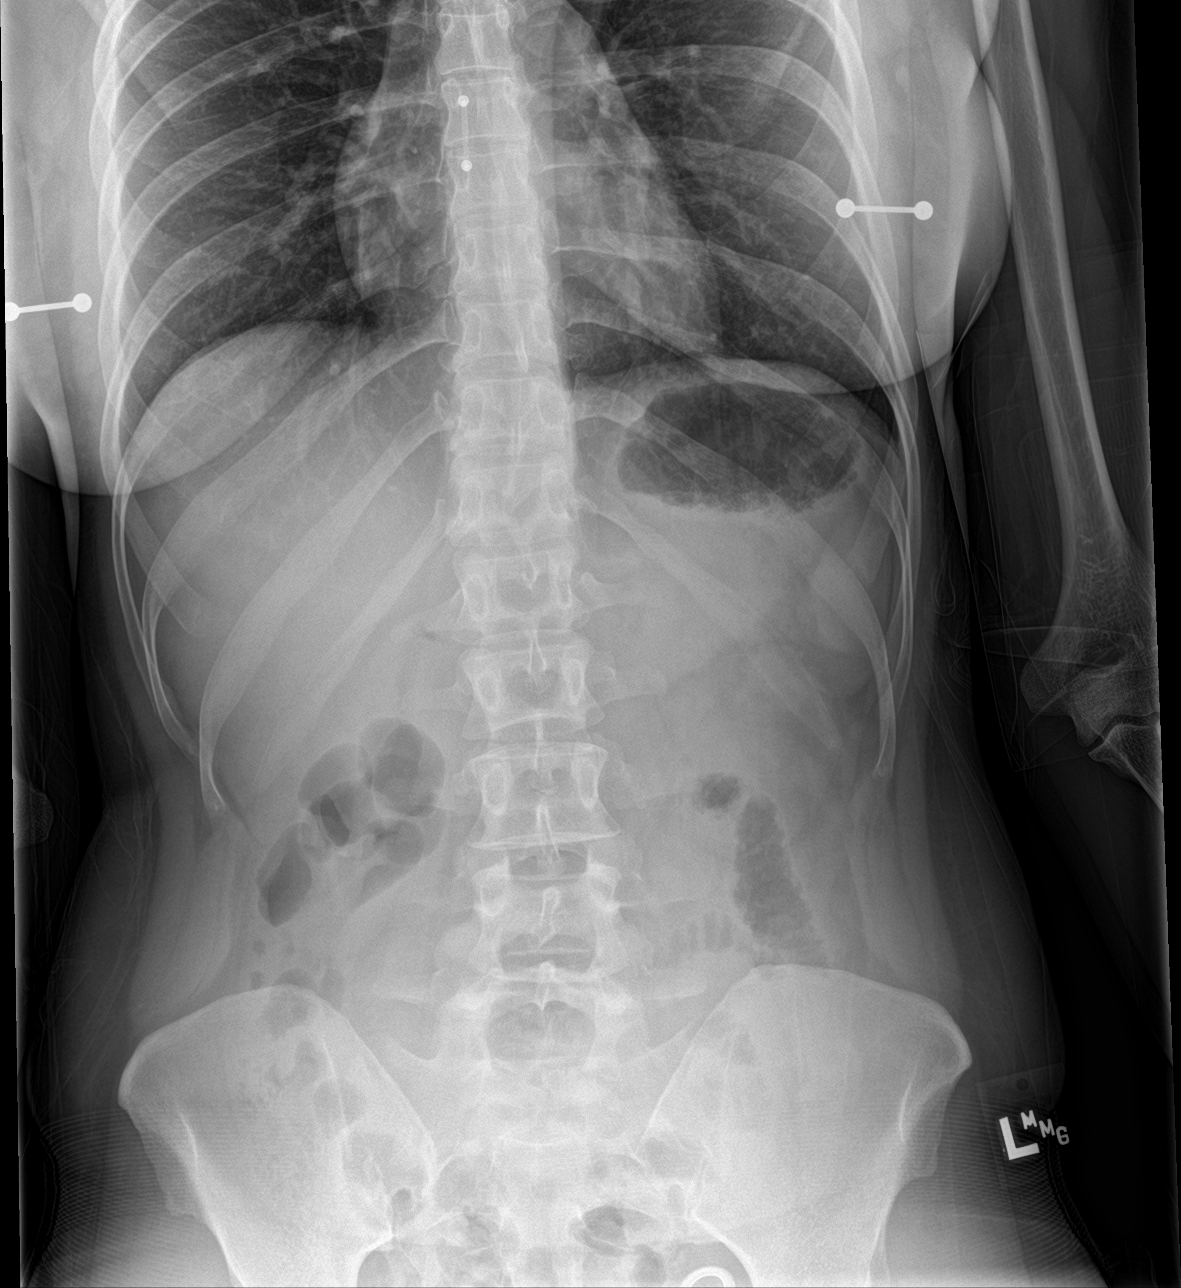

[abdomen supine]
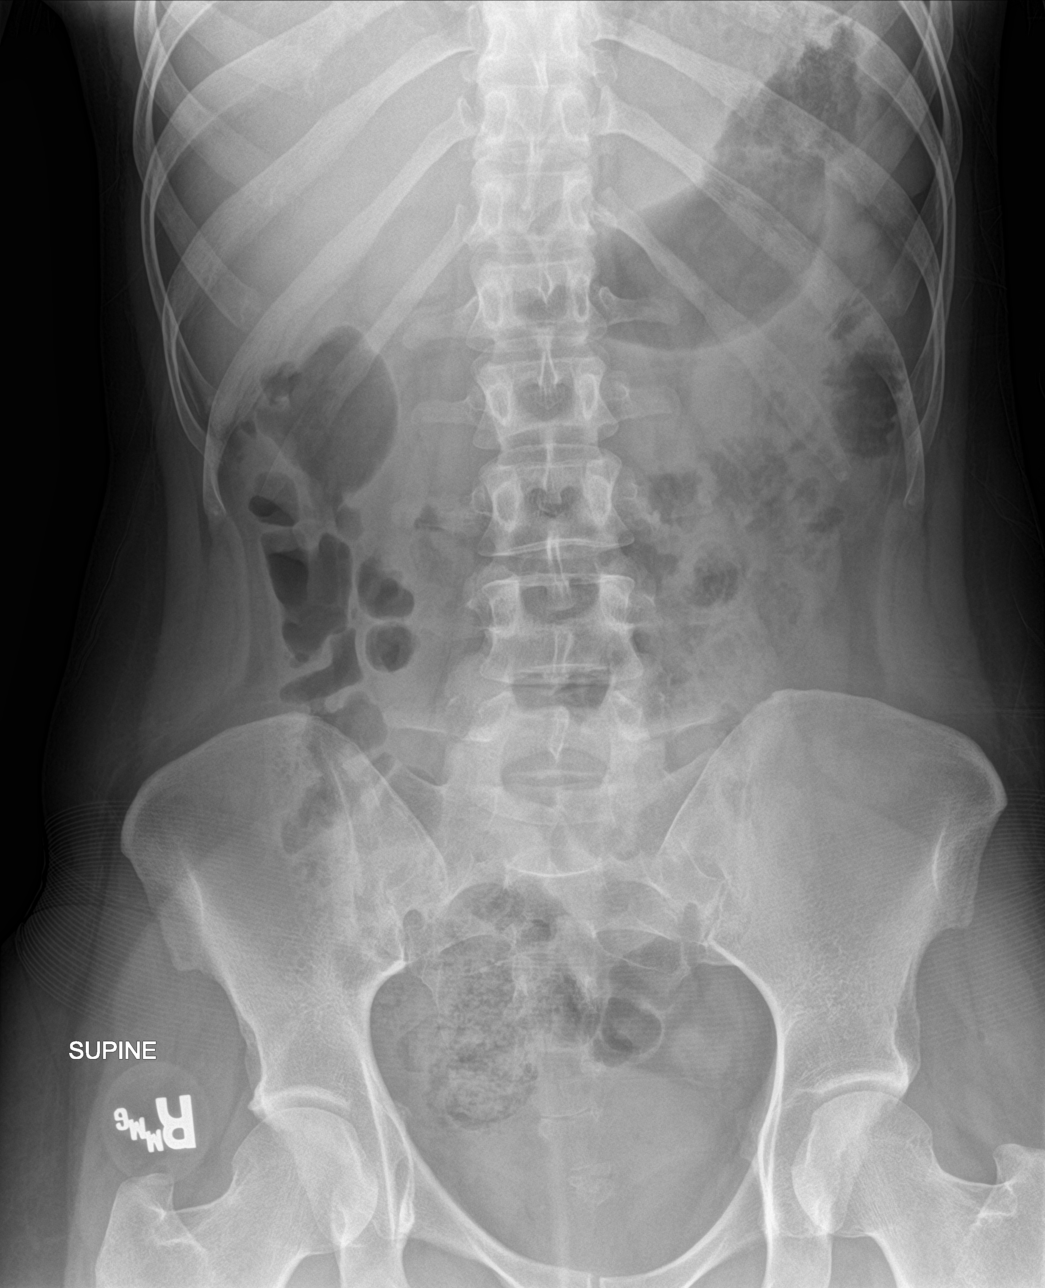

[3 of 3 positions shown; findings below may reference images not displayed]

FINDINGS: The lungs are clear. There is no pleural effusion or pneumothorax.
The cardiac silhouette is within normal limits.

Mildly thickened appearing jejunal folds may represent enteritis.
Clinical correlation is recommended. No bowel dilatation or evidence
of obstruction. No free air or radiopaque calculi. The soft tissues
and osseous structures are grossly unremarkable.
IMPRESSION: No acute cardiopulmonary process.

No bowel obstruction. Findings may represent enteritis. Clinical
correlation is recommended.

## 2018-11-26 ENCOUNTER — Ambulatory Visit (INDEPENDENT_AMBULATORY_CARE_PROVIDER_SITE_OTHER): Payer: BC Managed Care – PPO | Admitting: Obstetrics and Gynecology

## 2018-11-26 ENCOUNTER — Other Ambulatory Visit: Payer: Self-pay

## 2018-11-26 ENCOUNTER — Encounter: Payer: Self-pay | Admitting: Obstetrics and Gynecology

## 2018-11-26 VITALS — BP 101/65 | HR 91 | Wt 126.0 lb

## 2018-11-26 DIAGNOSIS — N898 Other specified noninflammatory disorders of vagina: Secondary | ICD-10-CM | POA: Diagnosis not present

## 2018-11-26 DIAGNOSIS — Z3480 Encounter for supervision of other normal pregnancy, unspecified trimester: Secondary | ICD-10-CM | POA: Insufficient documentation

## 2018-11-26 DIAGNOSIS — B373 Candidiasis of vulva and vagina: Secondary | ICD-10-CM

## 2018-11-26 DIAGNOSIS — Z8619 Personal history of other infectious and parasitic diseases: Secondary | ICD-10-CM

## 2018-11-26 DIAGNOSIS — N760 Acute vaginitis: Secondary | ICD-10-CM | POA: Diagnosis not present

## 2018-11-26 DIAGNOSIS — E0590 Thyrotoxicosis, unspecified without thyrotoxic crisis or storm: Secondary | ICD-10-CM | POA: Diagnosis not present

## 2018-11-26 DIAGNOSIS — Z124 Encounter for screening for malignant neoplasm of cervix: Secondary | ICD-10-CM

## 2018-11-26 DIAGNOSIS — Z3481 Encounter for supervision of other normal pregnancy, first trimester: Secondary | ICD-10-CM

## 2018-11-26 DIAGNOSIS — Z113 Encounter for screening for infections with a predominantly sexual mode of transmission: Secondary | ICD-10-CM | POA: Diagnosis not present

## 2018-11-26 DIAGNOSIS — R112 Nausea with vomiting, unspecified: Secondary | ICD-10-CM

## 2018-11-26 DIAGNOSIS — Z3A11 11 weeks gestation of pregnancy: Secondary | ICD-10-CM

## 2018-11-26 DIAGNOSIS — B9689 Other specified bacterial agents as the cause of diseases classified elsewhere: Secondary | ICD-10-CM | POA: Diagnosis not present

## 2018-11-26 DIAGNOSIS — K5900 Constipation, unspecified: Secondary | ICD-10-CM

## 2018-11-26 MED ORDER — BLOOD PRESSURE KIT DEVI: 1.0000 | 0 refills | Status: DC | PRN

## 2018-11-26 MED ORDER — PROMETHAZINE HCL 25 MG PO TABS: 25.0000 mg | ORAL_TABLET | Freq: Four times a day (QID) | ORAL | 2 refills | Status: DC | PRN

## 2018-11-26 MED ORDER — DOCUSATE SODIUM 100 MG PO CAPS: 100.0000 mg | ORAL_CAPSULE | Freq: Two times a day (BID) | ORAL | 2 refills | Status: DC | PRN

## 2018-11-26 NOTE — Addendum Note (Signed)
Addended by: Tristan Schroeder D on: 11/26/2018 01:45 PM   Modules accepted: Orders

## 2018-11-26 NOTE — Progress Notes (Signed)
Patient is in the office for NOB, reports occasional back pain.

## 2018-11-26 NOTE — Progress Notes (Signed)
INITIAL PRENATAL VISIT NOTE  Subjective:  Katie Bowers is a 28 y.o. G2P0010 at [redacted]w[redacted]d by LMP being seen today for her initial prenatal visit. This is an unplanned pregnancy. She and partner are happy with the pregnancy. She was using nothing for birth control previously. She has an obstetric history significant for TAB. She has a medical history significant for hyperthyroidism.  Patient reports nausea and constipation.  Contractions: Not present. Vag. Bleeding: None.   . Denies leaking of fluid.    Past Medical History:  Diagnosis Date  . ASCUS (atypical squamous cells of undetermined significance) on Pap smear 12/2009   with high grade HPV  . Chlamydia 06/2009, 12/2009  . Grave's disease 06/2010   sees Dr. Margaretmary Bayley  . Hyperthyroidism   . 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 No pertinent surgical history.  OB History  Gravida Para Term Preterm AB Living  2       1    SAB TAB Ectopic Multiple Live Births    1          # Outcome Date GA Lbr Len/2nd Weight Sex Delivery Anes PTL Lv  2 Current           1 TAB 2010            Social History   Socioeconomic History  . Marital status: Single    Spouse name: Not on file  . Number of children: 0  . Years of education: Not on file  . Highest education level: Not on file  Occupational History  . Not on file  Social Needs  . Financial resource strain: Not on file  . Food insecurity    Worry: Not on file    Inability: Not on file  . Transportation needs    Medical: Not on file    Non-medical: Not on file  Tobacco Use  . Smoking status: Never Smoker  . Smokeless tobacco: Never Used  Substance and Sexual Activity  . Alcohol use: Yes    Alcohol/week: 0.0 standard drinks    Comment: 2 drinks every other month.  . Drug use: Not Currently    Types: Marijuana  . Sexual activity: Yes    Partners: Male  Lifestyle  . Physical activity    Days per week: Not on file    Minutes  per session: Not on file  . Stress: Not on file  Relationships  . Social Musician on phone: Not on file    Gets together: Not on file    Attends religious service: Not on file    Active member of club or organization: Not on file    Attends meetings of clubs or organizations: Not on file    Relationship status: Not on file  Other Topics Concern  . Not on file  Social History Narrative   Lives with her mother, no pets.  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 Hyperlipidemia Mother   . Hypothyroidism Mother   . Cancer Maternal Uncle        lung cancer (smoker)  . Hyperthyroidism Paternal Aunt   . Hypertension Maternal Grandmother   . Hypothyroidism Paternal Grandmother      Current Outpatient Medications:  .  Prenatal Vit-Fe Fumarate-FA (PRENATAL MULTIVITAMIN) TABS tablet, Take 1 tablet by mouth daily at 12 noon., Disp: ,  Rfl:  .  docusate sodium (COLACE) 100 MG capsule, Take 1 capsule (100 mg total) by mouth 2 (two) times daily as needed., Disp: 30 capsule, Rfl: 2 .  promethazine (PHENERGAN) 25 MG tablet, Take 1 tablet (25 mg total) by mouth every 6 (six) hours as needed for nausea or vomiting., Disp: 30 tablet, Rfl: 2 .  valACYclovir (VALTREX) 500 MG tablet, For initial diagnosis use 2 tablets twice daily for 10 days. For episodic outbreaks use 1 tablet twice daily for 3 days. (Patient not taking: Reported on 08/26/2017), Disp: 60 tablet, Rfl: 5  Allergies  Allergen Reactions  . Crab [Shellfish Allergy]     Pt states allergy is just to crab and trout   . Methimazole [Methimazole] Swelling    Face and tongue.     Review of Systems: Negative except for what is mentioned in HPI.  Objective:   Vitals:   11/26/18 1021  BP: 101/65  Pulse: 91  Weight: 126 lb (57.2 kg)    Fetal Status: Fetal Heart Rate (bpm): 164         Physical Exam: BP 101/65   Pulse 91   Wt 126 lb (57.2 kg)   LMP  09/10/2018   BMI 21.63 kg/m  CONSTITUTIONAL: Well-developed, well-nourished female in no acute distress.  NEUROLOGIC: Alert and oriented to person, place, and time. Normal reflexes, muscle tone coordination. No cranial nerve deficit noted. PSYCHIATRIC: Normal mood and affect. Normal behavior. Normal judgment and thought content. SKIN: Skin is warm and dry. No rash noted. Not diaphoretic. No erythema. No pallor. HENT:  Normocephalic, atraumatic, External right and left ear normal. Oropharynx is clear and moist EYES: Conjunctivae and EOM are normal. Pupils are equal, round, and reactive to light. No scleral icterus.  NECK: Normal range of motion, supple, no masses CARDIOVASCULAR: Normal heart rate noted, regular rhythm RESPIRATORY: Effort and breath sounds normal, no problems with respiration noted BREASTS: symmetric, non-tender, no masses palpable ABDOMEN: Soft, nontender, nondistended, gravid. GU: normal appearing external female genitalia, nulliparous normal appearing cervix, scant white discharge in vagina, no lesions noted Bimanual: 12 weeks sized uterus, no adnexal tenderness or palpable lesions noted MUSCULOSKELETAL: Normal range of motion. EXT:  No edema and no tenderness. 2+ distal pulses.   Assessment and Plan:  Pregnancy: G2P0010 at [redacted]w[redacted]d by LMP  1. Supervision of other normal pregnancy, antepartum - genetic screening once pregnancy medicaid in place - Culture, OB Urine - Cervicovaginal ancillary only( [place]) - Obstetric Panel, Including HIV - Babyscripts Schedule Optimization - [place] MFM OB DETAIL +14 WK; Future - Declines flu shot Reviewed [facility] for [facility] structure, multiple providers, fellows, medical students, virtual visits, MyChart.   2. Hyperthyroidism - TSH - T3 - T4  3. Cervical cancer screening - Cytology - PAP( [place])  4. Constipation, unspecified constipation type colace  5. Non-intractable vomiting with nausea,  unspecified vomiting type phenergan  6. H/O herpes genitalis Has had one outbreak Reviewed need for CS if has active outbreak Suppressive therapy @ 36 weeks   Preterm labor symptoms and general obstetric precautions including but not limited to vaginal bleeding, contractions, leaking of fluid and fetal movement were reviewed in detail with the patient.  Please refer to After Visit Summary for other counseling recommendations.   Return in about 4 weeks (around 12/24/2018) for virtual, low OB.  Sloan Leiter 11/26/2018 11:04 AM

## 2018-11-27 LAB — OBSTETRIC PANEL, INCLUDING HIV
Antibody Screen: NEGATIVE
Basophils Absolute: 0 10*3/uL (ref 0.0–0.2)
Basos: 0 %
EOS (ABSOLUTE): 0.2 10*3/uL (ref 0.0–0.4)
Eos: 4 %
HIV Screen 4th Generation wRfx: NONREACTIVE
Hematocrit: 34.6 % (ref 34.0–46.6)
Hemoglobin: 11.6 g/dL (ref 11.1–15.9)
Hepatitis B Surface Ag: NEGATIVE
Immature Grans (Abs): 0 10*3/uL (ref 0.0–0.1)
Immature Granulocytes: 0 %
Lymphocytes Absolute: 1.6 10*3/uL (ref 0.7–3.1)
Lymphs: 30 %
MCH: 30.5 pg (ref 26.6–33.0)
MCHC: 33.5 g/dL (ref 31.5–35.7)
MCV: 91 fL (ref 79–97)
Monocytes Absolute: 0.4 10*3/uL (ref 0.1–0.9)
Monocytes: 8 %
Neutrophils Absolute: 3.1 10*3/uL (ref 1.4–7.0)
Neutrophils: 58 %
Platelets: 315 10*3/uL (ref 150–450)
RBC: 3.8 x10E6/uL (ref 3.77–5.28)
RDW: 12.6 % (ref 11.7–15.4)
RPR Ser Ql: NONREACTIVE
Rh Factor: POSITIVE
Rubella Antibodies, IGG: 4.65 index (ref >0.99)
WBC: 5.4 10*3/uL (ref 3.4–10.8)

## 2018-11-27 LAB — CERVICOVAGINAL ANCILLARY ONLY
Bacterial Vaginitis (gardnerella): POSITIVE — AB
Candida Glabrata: NEGATIVE
Candida Vaginitis: POSITIVE — AB
Chlamydia: NEGATIVE
Molecular Disclaimer: NEGATIVE
Molecular Disclaimer: NEGATIVE
Molecular Disclaimer: NEGATIVE
Molecular Disclaimer: NEGATIVE
Molecular Disclaimer: NORMAL
Molecular Disclaimer: NORMAL
Neisseria Gonorrhea: NEGATIVE
Trichomonas: NEGATIVE

## 2018-11-27 LAB — CYTOLOGY - PAP: Diagnosis: NEGATIVE

## 2018-11-27 LAB — TSH: TSH: 1.01 u[IU]/mL (ref 0.450–4.500)

## 2018-11-27 LAB — T3: T3, Total: 134 ng/dL (ref 71–180)

## 2018-11-27 LAB — T4: T4, Total: 11.5 ug/dL (ref 4.5–12.0)

## 2018-11-28 LAB — CULTURE, OB URINE

## 2018-11-28 LAB — URINE CULTURE, OB REFLEX

## 2018-11-28 MED ORDER — METRONIDAZOLE 500 MG PO TABS: 500.0000 mg | ORAL_TABLET | Freq: Two times a day (BID) | ORAL | 0 refills | Status: DC

## 2018-11-28 MED ORDER — TERCONAZOLE 0.8 % VA CREA: 1.0000 | TOPICAL_CREAM | Freq: Every day | VAGINAL | 0 refills | Status: DC

## 2018-11-28 NOTE — Addendum Note (Signed)
Addended by: Vivien Rota on: 11/28/2018 11:17 AM   Modules accepted: Orders

## 2018-12-03 ENCOUNTER — Other Ambulatory Visit (HOSPITAL_COMMUNITY): Payer: BC Managed Care – PPO

## 2018-12-24 ENCOUNTER — Telehealth (INDEPENDENT_AMBULATORY_CARE_PROVIDER_SITE_OTHER): Payer: BC Managed Care – PPO | Admitting: Obstetrics

## 2018-12-24 ENCOUNTER — Encounter: Payer: Self-pay | Admitting: Obstetrics

## 2018-12-24 VITALS — BP 94/61

## 2018-12-24 DIAGNOSIS — Z3A15 15 weeks gestation of pregnancy: Secondary | ICD-10-CM

## 2018-12-24 DIAGNOSIS — Z8619 Personal history of other infectious and parasitic diseases: Secondary | ICD-10-CM

## 2018-12-24 DIAGNOSIS — Z3480 Encounter for supervision of other normal pregnancy, unspecified trimester: Secondary | ICD-10-CM

## 2018-12-24 DIAGNOSIS — O99112 Other diseases of the blood and blood-forming organs and certain disorders involving the immune mechanism complicating pregnancy, second trimester: Secondary | ICD-10-CM

## 2018-12-24 DIAGNOSIS — E0590 Thyrotoxicosis, unspecified without thyrotoxic crisis or storm: Secondary | ICD-10-CM

## 2018-12-24 NOTE — Progress Notes (Signed)
   TELEHEALTH OBSTETRICS PRENATAL VIRTUAL VIDEO VISIT ENCOUNTER NOTE  Provider location: [facility] [place]   I connected with [name] on 12/24/18 at 11:00 AM EDT by OB MyChart Video Encounter at home and verified that I am speaking with the correct person using two identifiers.   I discussed the limitations, risks, security and privacy concerns of performing an evaluation and management service virtually and the availability of in person appointments. I also discussed with the patient that there may be a patient responsible charge related to this service. The patient expressed understanding and agreed to proceed. Subjective:  Katie Bowers is a 28 y.o. G2P0010 at [redacted]w[redacted]d being seen today for ongoing prenatal care.  She is currently monitored for the following issues for this high-risk pregnancy and has Grave's disease; Pure hypercholesterolemia; Supervision of other normal pregnancy, antepartum; Hyperthyroidism; and H/O herpes genitalis on their problem list.  Patient reports no complaints.  Contractions: Not present. Vag. Bleeding: None.   . Denies any leaking of fluid.   The following portions of the patient's history were reviewed and updated as appropriate: allergies, current medications, past family history, past medical history, past social history, past surgical history and problem list.   Objective:   Vitals:   12/24/18 1059  BP: 94/61    Fetal Status:           General:  Alert, oriented and cooperative. Patient is in no acute distress.  Respiratory: Normal respiratory effort, no problems with respiration noted  Mental Status: Normal mood and affect. Normal behavior. Normal judgment and thought content.  Rest of physical exam deferred due to type of encounter  Imaging: No results found.  Assessment and Plan:  Pregnancy: G2P0010 at [redacted]w[redacted]d 1. Supervision of other normal pregnancy, antepartum  2. Hyperthyroidism - clinically stable.  Followed by  Endocrinology.  3. H/O herpes genitalis - clinically stable.  Valtrex suppression starting in 3rd trimester  Preterm labor symptoms and general obstetric precautions including but not limited to vaginal bleeding, contractions, leaking of fluid and fetal movement were reviewed in detail with the patient. I discussed the assessment and treatment plan with the patient. The patient was provided an opportunity to ask questions and all were answered. The patient agreed with the plan and demonstrated an understanding of the instructions. The patient was advised to call back or seek an in-person office evaluation/go to [facility] at [facility] for any urgent or concerning symptoms. Please refer to After Visit Summary for other counseling recommendations.   I provided 10 minutes of face-to-face time during this encounter.  Return in about 4 weeks (around 01/21/2019) for MyChart HOB-Faculty Only.  Future Appointments  Date Time Provider [place]  01/21/2019 10:00 AM [facility]  01/21/2019 10:00 AM [facility] [place] 3 WH-M[facility] M[facility]-US     , [place] [facility] 12/24/2018

## 2018-12-24 NOTE — Progress Notes (Signed)
Pt is on the phone preparing for virtual visit with provider. [redacted]w[redacted]d.

## 2019-01-20 DIAGNOSIS — Z20828 Contact with and (suspected) exposure to other viral communicable diseases: Secondary | ICD-10-CM | POA: Diagnosis not present

## 2019-01-20 DIAGNOSIS — Z03818 Encounter for observation for suspected exposure to other biological agents ruled out: Secondary | ICD-10-CM | POA: Diagnosis not present

## 2019-01-21 ENCOUNTER — Telehealth (INDEPENDENT_AMBULATORY_CARE_PROVIDER_SITE_OTHER): Payer: BC Managed Care – PPO | Admitting: Obstetrics and Gynecology

## 2019-01-21 ENCOUNTER — Encounter: Payer: Self-pay | Admitting: Obstetrics and Gynecology

## 2019-01-21 ENCOUNTER — Ambulatory Visit (HOSPITAL_COMMUNITY)
Admission: RE | Admit: 2019-01-21 | Discharge: 2019-01-21 | Disposition: A | Discharge status: Home / Self Care | Payer: BC Managed Care – PPO | Source: Ambulatory Visit | Attending: Obstetrics and Gynecology | Admitting: Obstetrics and Gynecology

## 2019-01-21 ENCOUNTER — Other Ambulatory Visit: Payer: Self-pay

## 2019-01-21 ENCOUNTER — Ambulatory Visit (HOSPITAL_COMMUNITY): Payer: BC Managed Care – PPO

## 2019-01-21 DIAGNOSIS — Z3A19 19 weeks gestation of pregnancy: Secondary | ICD-10-CM

## 2019-01-21 DIAGNOSIS — Z8619 Personal history of other infectious and parasitic diseases: Secondary | ICD-10-CM

## 2019-01-21 DIAGNOSIS — Z3480 Encounter for supervision of other normal pregnancy, unspecified trimester: Secondary | ICD-10-CM

## 2019-01-21 DIAGNOSIS — E0590 Thyrotoxicosis, unspecified without thyrotoxic crisis or storm: Secondary | ICD-10-CM

## 2019-01-21 DIAGNOSIS — Z3492 Encounter for supervision of normal pregnancy, unspecified, second trimester: Secondary | ICD-10-CM

## 2019-01-21 NOTE — Progress Notes (Signed)
   TELEHEALTH OBSTETRICS PRENATAL VIRTUAL VIDEO VISIT ENCOUNTER NOTE  Provider location: [facility] for [place] [facility] [place]   I connected with Katie Bowers on 01/21/19 at  9:00 AM EST by MyChart Video Encounter at home and verified that I am speaking with the correct person using two identifiers.   I discussed the limitations, risks, security and privacy concerns of performing an evaluation and management service virtually and the availability of in person appointments. I also discussed with the patient that there may be a patient responsible charge related to this service. The patient expressed understanding and agreed to proceed. Subjective:  Katie Bowers is a 28 y.o. G2P0010 at [redacted]w[redacted]d being seen today for ongoing prenatal care.  She is currently monitored for the following issues for this low-risk pregnancy and has Grave's disease; Pure hypercholesterolemia; Supervision of other normal pregnancy, antepartum; Hyperthyroidism; and H/O herpes genitalis on their problem list.  Patient reports no complaints currently on quarantine due to covid-19 exposure at work. Patient denies any symptoms such as fever, cough, sore through or generalized malaise.  Contractions: Not present. Vag. Bleeding: None.  Movement: Present. Denies any leaking of fluid.   The following portions of the patient's history were reviewed and updated as appropriate: allergies, current medications, past family history, past medical history, past social history, past surgical history and problem list.   Objective:  There were no vitals filed for this visit.  Fetal Status:     Movement: Present     General:  Alert, oriented and cooperative. Patient is in no acute distress.  Respiratory: Normal respiratory effort, no problems with respiration noted  Mental Status: Normal mood and affect. Normal behavior. Normal judgment and thought content.  Rest of physical exam deferred due to type of encounter  Imaging: No results  found.  Assessment and Plan:  Pregnancy: G2P0010 at [redacted]w[redacted]d 1. Supervision of other normal pregnancy, antepartum Patient is doing well without complaints Reviewed concerning covid-19 symptoms and reasons to present to the ED Anatomy ultrasound scheduled 12/11  2. H/O herpes genitalis Prophylaxis in third trimester  3. Hyperthyroidism Stable, without medication  Preterm labor symptoms and general obstetric precautions including but not limited to vaginal bleeding, contractions, leaking of fluid and fetal movement were reviewed in detail with the patient. I discussed the assessment and treatment plan with the patient. The patient was provided an opportunity to ask questions and all were answered. The patient agreed with the plan and demonstrated an understanding of the instructions. The patient was advised to call back or seek an in-person office evaluation/go to MAU at [facility] for any urgent or concerning symptoms. Please refer to After Visit Summary for other counseling recommendations.   I provided 11 minutes of face-to-face time during this encounter.  No follow-ups on file.  Future Appointments  Date Time Provider [place]  01/21/2019  9:00 AM [place] Kewley, Vickii Chafe, MD [place] None  02/06/2019  8:30 AM WH-MFC NURSE [place] MFC-US  02/06/2019  8:30 AM [place] 5 WH-MFCUS MFC-US    Mora Bellman, MD Center for [employer], [place]

## 2019-01-21 NOTE — Progress Notes (Signed)
Virtual ROB with no concerns today.

## 2019-02-06 ENCOUNTER — Encounter (HOSPITAL_COMMUNITY): Payer: Self-pay

## 2019-02-06 ENCOUNTER — Other Ambulatory Visit (HOSPITAL_COMMUNITY): Payer: Self-pay | Admitting: *Deleted

## 2019-02-06 ENCOUNTER — Ambulatory Visit (HOSPITAL_COMMUNITY)
Admission: RE | Admit: 2019-02-06 | Discharge: 2019-02-06 | Disposition: A | Discharge status: Home / Self Care | Payer: BC Managed Care – PPO | Source: Ambulatory Visit | Attending: Obstetrics and Gynecology | Admitting: Obstetrics and Gynecology

## 2019-02-06 ENCOUNTER — Other Ambulatory Visit: Payer: BC Managed Care – PPO

## 2019-02-06 ENCOUNTER — Ambulatory Visit (HOSPITAL_COMMUNITY): Payer: BC Managed Care – PPO | Admitting: *Deleted

## 2019-02-06 ENCOUNTER — Other Ambulatory Visit: Payer: Self-pay

## 2019-02-06 VITALS — BP 115/67 | HR 93 | Temp 97.9°F

## 2019-02-06 DIAGNOSIS — E0590 Thyrotoxicosis, unspecified without thyrotoxic crisis or storm: Secondary | ICD-10-CM

## 2019-02-06 DIAGNOSIS — Z3480 Encounter for supervision of other normal pregnancy, unspecified trimester: Secondary | ICD-10-CM | POA: Diagnosis not present

## 2019-02-06 DIAGNOSIS — O99282 Endocrine, nutritional and metabolic diseases complicating pregnancy, second trimester: Secondary | ICD-10-CM

## 2019-02-06 DIAGNOSIS — Z3A21 21 weeks gestation of pregnancy: Secondary | ICD-10-CM

## 2019-02-16 ENCOUNTER — Encounter: Payer: Self-pay | Admitting: Obstetrics and Gynecology

## 2019-02-18 ENCOUNTER — Telehealth (INDEPENDENT_AMBULATORY_CARE_PROVIDER_SITE_OTHER): Payer: BC Managed Care – PPO | Admitting: Obstetrics

## 2019-02-18 ENCOUNTER — Encounter: Payer: Self-pay | Admitting: Obstetrics

## 2019-02-18 DIAGNOSIS — Z3A21 21 weeks gestation of pregnancy: Secondary | ICD-10-CM

## 2019-02-18 DIAGNOSIS — O99282 Endocrine, nutritional and metabolic diseases complicating pregnancy, second trimester: Secondary | ICD-10-CM

## 2019-02-18 DIAGNOSIS — Z3480 Encounter for supervision of other normal pregnancy, unspecified trimester: Secondary | ICD-10-CM

## 2019-02-18 DIAGNOSIS — M549 Dorsalgia, unspecified: Secondary | ICD-10-CM

## 2019-02-18 DIAGNOSIS — O26892 Other specified pregnancy related conditions, second trimester: Secondary | ICD-10-CM

## 2019-02-18 DIAGNOSIS — Z8619 Personal history of other infectious and parasitic diseases: Secondary | ICD-10-CM

## 2019-02-18 DIAGNOSIS — E0590 Thyrotoxicosis, unspecified without thyrotoxic crisis or storm: Secondary | ICD-10-CM

## 2019-02-18 MED ORDER — COMFORT FIT MATERNITY SUPP SM MISC: 0 refills | Status: DC

## 2019-02-18 NOTE — Progress Notes (Signed)
Pt presents for Mychart ROB. Pt identified with 2 patient identifiers. Pt has no concerns.

## 2019-02-18 NOTE — Progress Notes (Signed)
TELEHEALTH OBSTETRICS PRENATAL VIRTUAL VIDEO VISIT ENCOUNTER NOTE  Provider location: [facility] [place]   I connected with Katie Bowers on 02/18/19 at  9:15 AM EST by [facility] MyChart Video Encounter at home and verified that I am speaking with the correct person using two identifiers.   I discussed the limitations, risks, security and privacy concerns of performing an evaluation and management service virtually and the availability of in person appointments. I also discussed with the patient that there may be a patient responsible charge related to this service. The patient expressed understanding and agreed to proceed. Subjective:  Katie Bowers is a 28 y.o. G2P0010 at [redacted]w[redacted]d being seen today for ongoing prenatal care.  She is currently monitored for the following issues for this high-risk pregnancy and has Grave's disease; Pure hypercholesterolemia; Supervision of other normal pregnancy, antepartum; Hyperthyroidism; and H/O herpes genitalis on their problem list.  Patient reports backache.  Contractions: Not present. Vag. Bleeding: None.  Movement: Present. Denies any leaking of fluid.   The following portions of the patient's history were reviewed and updated as appropriate: allergies, current medications, past family history, past medical history, past social history, past surgical history and problem list.   Objective:  There were no vitals filed for this visit.  Fetal Status:     Movement: Present     General:  Alert, oriented and cooperative. Patient is in no acute distress.  Respiratory: Normal respiratory effort, no problems with respiration noted  Mental Status: Normal mood and affect. Normal behavior. Normal judgment and thought content.  Rest of physical exam deferred due to type of encounter  Imaging: [place] MFM OB DETAIL +14 WK  Result Date: 02/06/2019 ----------------------------------------------------------------------  OBSTETRICS REPORT                        (Signed Final 02/06/2019 09:59 am) ---------------------------------------------------------------------- Patient Info  ID #:       [id number]                          D.O.B.:  1990/05/01 (28 yrs)  Name:       Katie Bowers                  Visit Date: 02/06/2019 09:07 am ---------------------------------------------------------------------- Performed By  Performed By:     Marcellina Millin          Ref. Address:     [address]                                                             [phone]  [place]                                                             [address]  Attending:        Ma Rings MD         [facility]  Referred By:      [facility] ---------------------------------------------------------------------- Orders   #  Description                          Code         Ordered By   1  [place] MFM OB DETAIL +14 WK              76811.01     KELLY DAVIS  ----------------------------------------------------------------------   #  Order #                    Accession #                 Episode #   1  [id number]                  [id number]                  [id number]  ---------------------------------------------------------------------- Indications   Hyperthyroid                                   O99.280 E05.90   Encounter for antenatal screening for          Z36.3   malformations (No NIPS)   [redacted] weeks gestation of pregnancy                Z3A.21  ---------------------------------------------------------------------- Fetal Evaluation  Num Of Fetuses:         1  Fetal Heart Rate(bpm):  149  Cardiac Activity:       Observed  Presentation:           Cephalic  Placenta:               Posterior  P. Cord Insertion:      Visualized  Amniotic Fluid  AFI FV:      Within  normal limits ---------------------------------------------------------------------- Biometry  BPD:      54.7  mm     G. Age:  22w 5d         91  %    CI:         78.2   %    70 - 86  FL/HC:      20.3   %    15.9 - 20.3  HC:      195.7  mm     G. Age:  21w 5d         62  %    HC/AC:      1.09        1.06 - 1.25  AC:      179.9  mm     G. Age:  22w 6d         87  %    FL/BPD:     72.8   %  FL:       39.8  mm     G. Age:  22w 6d         87  %    FL/AC:      22.1   %    20 - 24  HUM:      35.9  mm     G. Age:  22w 4d         76  %  CER:      25.2  mm     G. Age:  23w 1d         89  %  Est. FW:     529  gm      1 lb 3 oz     98  % ---------------------------------------------------------------------- OB History  Gravidity:    2         Term:   0        Prem:   0        SAB:   0  TOP:          1       Ectopic:  0        Living: 0 ---------------------------------------------------------------------- Gestational Age  LMP:           21w 2d        Date:  09/10/18                 EDD:   06/17/19  U/S Today:     22w 4d                                        EDD:   06/08/19  Best:          Audrea Muscat 2d     Det. By:  LMP  (09/10/18)          EDD:   06/17/19 ---------------------------------------------------------------------- Anatomy  Cranium:               Appears normal         Aortic Arch:            Appears normal  Cavum:                 Appears normal         Ductal Arch:            Appears normal  Ventricles:            Appears normal         Diaphragm:              Appears normal  Choroid Plexus:        Appears normal         Stomach:  Appears normal, left                                                                        sided  Cerebellum:            Appears normal         Abdomen:                Appears normal  Posterior Fossa:       Appears normal         Abdominal Wall:         Appears nml (cord                                                                         insert, abd wall)  Nuchal Fold:           Not applicable (>20    Cord Vessels:           Appears normal ([redacted]                         wks GA)                                        vessel cord)  Face:                  Appears normal         Kidneys:                Appear normal                         (orbits and profile)  Lips:                  Appears normal         Bladder:                Appears normal  Thoracic:              Appears normal         Spine:                  Appears normal  Heart:                 Appears normal         Upper Extremities:      Appears normal                         (4CH, axis, and                         situs)  RVOT:                  Appears normal  Lower Extremities:      Appears normal  LVOT:                  Appears normal  Other:  Fetus appears to be a female. Nasal bone visualized. Heels and 5th          digit visualized. ---------------------------------------------------------------------- Cervix Uterus Adnexa  Cervix  Length:            4.9  cm.  Normal appearance by transabdominal scan. ---------------------------------------------------------------------- Comments  This patient was seen for a detailed fetal anatomy scan due  to a history of hyperthyroidism that is not currently treated  with any medications. She denies any other significant past  medical history and denies any problems in her current  pregnancy.  She reports that she is going to have a screening test for fetal  aneuploidy drawn today.  On today's exam, the overall EFW measures at the 98th  percentile for her gestational age.  The patient reports that  she is certain of her last menstrual period.  There was normal  amniotic fluid noted on today's ultrasound exam.  There were no obvious fetal anomalies noted on today's  ultrasound exam.  However, today's exam was limited due to  the fetal position.  The patient was informed that anomalies may be missed due  to technical limitations. If the  fetus is in a suboptimal position  or maternal habitus is increased, visualization of the fetus in  the maternal uterus may be impaired.  A follow-up exam was scheduled in 4 weeks to obtain better  views of the fetal anatomy.  We will also confirm her dates at  that exam.  Due to her history of hyperthyroidism, she should have her  thyroid function tests checked once each trimester. ----------------------------------------------------------------------                   Ma RingsVictor Fang, MD Electronically Signed Final Report   02/06/2019 09:59 am ----------------------------------------------------------------------   Assessment and Plan:  Pregnancy: G2P0010 at 7612w0d 1. Supervision of other normal pregnancy, antepartum  2. Hyperthyroidism - clinically stable  3. H/O herpes genitalis - start Valtrex suppression in 3rd trimester  4. Backache symptom Rx: - Elastic Bandages & Supports (COMFORT FIT MATERNITY SUPP SM) MISC; Wear as directed.  Dispense: 1 each; Refill: 0   Preterm labor symptoms and general obstetric precautions including but not limited to vaginal bleeding, contractions, leaking of fluid and fetal movement were reviewed in detail with the patient. I discussed the assessment and treatment plan with the patient. The patient was provided an opportunity to ask questions and all were answered. The patient agreed with the plan and demonstrated an understanding of the instructions. The patient was advised to call back or seek an in-person office evaluation/[facility] for any urgent or concerning symptoms. Please refer to After Visit Summary for other counseling recommendations.   I provided 10 minutes of face-to-face time during this encounter.  [facility] about 4 weeks (around 03[facility]18/2019[facility].  Future Appointments  Date Time Provider Department Center  03/09/2019  9:30 AM WH-MFC US 1 WH-MFCUS MFC-US  03/09/2019  9:40 AM WH-MFC NURSE WH-MFC  MFC-US    Coral Ceoharles Jaques Mineer, MD [facility], [facility] 02/18/2019

## 2019-03-09 ENCOUNTER — Ambulatory Visit (HOSPITAL_COMMUNITY)
Admission: RE | Admit: 2019-03-09 | Discharge: 2019-03-09 | Disposition: A | Discharge status: Home / Self Care | Payer: Medicaid Other | Source: Ambulatory Visit | Attending: Obstetrics and Gynecology | Admitting: Obstetrics and Gynecology

## 2019-03-09 ENCOUNTER — Encounter (HOSPITAL_COMMUNITY): Payer: Self-pay

## 2019-03-09 ENCOUNTER — Other Ambulatory Visit (HOSPITAL_COMMUNITY): Payer: Self-pay | Admitting: *Deleted

## 2019-03-09 ENCOUNTER — Ambulatory Visit (HOSPITAL_COMMUNITY): Payer: Medicaid Other

## 2019-03-09 ENCOUNTER — Other Ambulatory Visit: Payer: Self-pay

## 2019-03-09 DIAGNOSIS — E0580 Other thyrotoxicosis without thyrotoxic crisis or storm: Secondary | ICD-10-CM

## 2019-03-09 DIAGNOSIS — O283 Abnormal ultrasonic finding on antenatal screening of mother: Secondary | ICD-10-CM

## 2019-03-09 DIAGNOSIS — O99280 Endocrine, nutritional and metabolic diseases complicating pregnancy, unspecified trimester: Secondary | ICD-10-CM

## 2019-03-09 DIAGNOSIS — Z3A29 29 weeks gestation of pregnancy: Secondary | ICD-10-CM | POA: Diagnosis not present

## 2019-03-09 DIAGNOSIS — E0590 Thyrotoxicosis, unspecified without thyrotoxic crisis or storm: Secondary | ICD-10-CM

## 2019-03-09 DIAGNOSIS — Z362 Encounter for other antenatal screening follow-up: Secondary | ICD-10-CM | POA: Diagnosis not present

## 2019-03-09 DIAGNOSIS — O99282 Endocrine, nutritional and metabolic diseases complicating pregnancy, second trimester: Secondary | ICD-10-CM | POA: Insufficient documentation

## 2019-03-18 DIAGNOSIS — Z20828 Contact with and (suspected) exposure to other viral communicable diseases: Secondary | ICD-10-CM | POA: Diagnosis not present

## 2019-04-02 ENCOUNTER — Ambulatory Visit (INDEPENDENT_AMBULATORY_CARE_PROVIDER_SITE_OTHER): Payer: Medicaid Other | Admitting: Obstetrics & Gynecology

## 2019-04-02 ENCOUNTER — Other Ambulatory Visit: Payer: Medicaid Other

## 2019-04-02 ENCOUNTER — Other Ambulatory Visit: Payer: Self-pay

## 2019-04-02 DIAGNOSIS — Z3480 Encounter for supervision of other normal pregnancy, unspecified trimester: Secondary | ICD-10-CM

## 2019-04-02 DIAGNOSIS — Z3A29 29 weeks gestation of pregnancy: Secondary | ICD-10-CM

## 2019-04-02 DIAGNOSIS — Z3483 Encounter for supervision of other normal pregnancy, third trimester: Secondary | ICD-10-CM

## 2019-04-02 DIAGNOSIS — Z23 Encounter for immunization: Secondary | ICD-10-CM | POA: Diagnosis not present

## 2019-04-02 NOTE — Patient Instructions (Signed)

## 2019-04-02 NOTE — Progress Notes (Signed)
   PRENATAL VISIT NOTE  Subjective:  Katie Bowers is a 29 y.o. G2P0010 at [redacted]w[redacted]d being seen today for ongoing prenatal care.  She is currently monitored for the following issues for this low-risk pregnancy and has Grave's disease; Pure hypercholesterolemia; Supervision of other normal pregnancy, antepartum; Hyperthyroidism; and H/O herpes genitalis on their problem list.  Patient reports no complaints.  Contractions: Not present. Vag. Bleeding: None.  Movement: Present. Denies leaking of fluid.   The following portions of the patient's history were reviewed and updated as appropriate: allergies, current medications, past family history, past medical history, past social history, past surgical history and problem list.   Objective:   Vitals:   04/02/19 0843  BP: 110/74  Pulse: (!) 109  Weight: 155 lb (70.3 kg)    Fetal Status:     Movement: Present     General:  Alert, oriented and cooperative. Patient is in no acute distress.  Skin: Skin is warm and dry. No rash noted.   Cardiovascular: Normal heart rate noted  Respiratory: Normal respiratory effort, no problems with respiration noted  Abdomen: Soft, gravid, appropriate for gestational age.  Pain/Pressure: Absent     Pelvic: Cervical exam deferred        Extremities: Normal range of motion.  Edema: None  Mental Status: Normal mood and affect. Normal behavior. Normal judgment and thought content.   Assessment and Plan:  Pregnancy: G2P0010 at [redacted]w[redacted]d 1. Supervision of other normal pregnancy, antepartum Routine 28 week - Glucose Tolerance, 2 Hours w/1 Hour - HIV Antibody (routine testing w rflx) - RPR - CBC - Tdap vaccine greater than or equal to 7yo IM  Preterm labor symptoms and general obstetric precautions including but not limited to vaginal bleeding, contractions, leaking of fluid and fetal movement were reviewed in detail with the patient. Please refer to After Visit Summary for other counseling recommendations.   Return  in about 2 weeks (around 04/16/2019) for virtual.  Future Appointments  Date Time Provider Department Center  04/02/2019 10:45 AM Adam Phenix, MD [facility] None  04/10/2019  1:10 PM WH-MFC NURSE WH-MFC MFC-US  04/10/2019  1:15 PM WH-MFC [place] 4 WH-MFCUS MFC-US    Scheryl Darter, MD

## 2019-04-03 ENCOUNTER — Other Ambulatory Visit: Payer: Self-pay | Admitting: Obstetrics & Gynecology

## 2019-04-03 DIAGNOSIS — Z3480 Encounter for supervision of other normal pregnancy, unspecified trimester: Secondary | ICD-10-CM

## 2019-04-03 LAB — GLUCOSE TOLERANCE, 2 HOURS W/ 1HR
Glucose, 1 hour: 159 mg/dL (ref 65–179)
Glucose, 2 hour: 104 mg/dL (ref 65–152)
Glucose, Fasting: 85 mg/dL (ref 65–91)

## 2019-04-03 LAB — CBC
Hematocrit: 29.8 % — ABNORMAL LOW (ref 34.0–46.6)
Hemoglobin: 10 g/dL — ABNORMAL LOW (ref 11.1–15.9)
MCH: 30.4 pg (ref 26.6–33.0)
MCHC: 33.6 g/dL (ref 31.5–35.7)
MCV: 91 fL (ref 79–97)
Platelets: 280 10*3/uL (ref 150–450)
RBC: 3.29 x10E6/uL — ABNORMAL LOW (ref 3.77–5.28)
RDW: 11.8 % (ref 11.7–15.4)
WBC: 7.9 10*3/uL (ref 3.4–10.8)

## 2019-04-03 LAB — RPR: RPR Ser Ql: NONREACTIVE

## 2019-04-03 LAB — HIV ANTIBODY (ROUTINE TESTING W REFLEX): HIV Screen 4th Generation wRfx: NONREACTIVE

## 2019-04-03 MED ORDER — FERROUS SULFATE 325 (65 FE) MG PO TABS: 325.0000 mg | ORAL_TABLET | Freq: Every day | ORAL | 3 refills | Status: DC

## 2019-04-10 ENCOUNTER — Other Ambulatory Visit (HOSPITAL_COMMUNITY): Payer: Self-pay | Admitting: *Deleted

## 2019-04-10 ENCOUNTER — Other Ambulatory Visit: Payer: Self-pay

## 2019-04-10 ENCOUNTER — Encounter (HOSPITAL_COMMUNITY): Payer: Self-pay

## 2019-04-10 ENCOUNTER — Ambulatory Visit (HOSPITAL_COMMUNITY)
Admission: RE | Admit: 2019-04-10 | Discharge: 2019-04-10 | Disposition: A | Discharge status: Home / Self Care | Payer: Medicaid Other | Source: Ambulatory Visit | Attending: Obstetrics and Gynecology | Admitting: Obstetrics and Gynecology

## 2019-04-10 ENCOUNTER — Ambulatory Visit (HOSPITAL_COMMUNITY): Payer: Medicaid Other | Admitting: *Deleted

## 2019-04-10 VITALS — BP 111/67 | HR 96 | Temp 98.0°F

## 2019-04-10 DIAGNOSIS — E0590 Thyrotoxicosis, unspecified without thyrotoxic crisis or storm: Secondary | ICD-10-CM | POA: Diagnosis present

## 2019-04-10 DIAGNOSIS — E0580 Other thyrotoxicosis without thyrotoxic crisis or storm: Secondary | ICD-10-CM | POA: Diagnosis not present

## 2019-04-10 DIAGNOSIS — Z3A30 30 weeks gestation of pregnancy: Secondary | ICD-10-CM

## 2019-04-10 DIAGNOSIS — O99280 Endocrine, nutritional and metabolic diseases complicating pregnancy, unspecified trimester: Secondary | ICD-10-CM

## 2019-04-10 DIAGNOSIS — O99283 Endocrine, nutritional and metabolic diseases complicating pregnancy, third trimester: Secondary | ICD-10-CM

## 2019-04-10 DIAGNOSIS — Z362 Encounter for other antenatal screening follow-up: Secondary | ICD-10-CM

## 2019-04-15 ENCOUNTER — Telehealth (INDEPENDENT_AMBULATORY_CARE_PROVIDER_SITE_OTHER): Payer: Medicaid Other | Admitting: Obstetrics & Gynecology

## 2019-04-15 VITALS — BP 114/78 | HR 105

## 2019-04-15 DIAGNOSIS — Z3A31 31 weeks gestation of pregnancy: Secondary | ICD-10-CM

## 2019-04-15 DIAGNOSIS — E0590 Thyrotoxicosis, unspecified without thyrotoxic crisis or storm: Secondary | ICD-10-CM

## 2019-04-15 DIAGNOSIS — Z3480 Encounter for supervision of other normal pregnancy, unspecified trimester: Secondary | ICD-10-CM

## 2019-04-15 DIAGNOSIS — M549 Dorsalgia, unspecified: Secondary | ICD-10-CM

## 2019-04-15 DIAGNOSIS — O99283 Endocrine, nutritional and metabolic diseases complicating pregnancy, third trimester: Secondary | ICD-10-CM

## 2019-04-15 MED ORDER — PROMETHAZINE HCL 25 MG PO TABS: 25.0000 mg | ORAL_TABLET | Freq: Four times a day (QID) | ORAL | 2 refills | Status: DC | PRN

## 2019-04-15 MED ORDER — PANTOPRAZOLE SODIUM 40 MG PO TBEC: 40.0000 mg | DELAYED_RELEASE_TABLET | Freq: Every day | ORAL | 2 refills | Status: DC

## 2019-04-15 MED ORDER — COMFORT FIT MATERNITY SUPP SM MISC: 0 refills | Status: DC

## 2019-04-15 NOTE — Progress Notes (Signed)
I connected with  Geanie Logan on 04/15/19 by a video enabled telemedicine application and verified that I am speaking with the correct person using two identifiers.   I discussed the limitations of evaluation and management by telemedicine. The patient expressed understanding and agreed to proceed.  MyChart OB.  Needs refills on Phenergan a Maternity Belt.

## 2019-04-15 NOTE — Patient Instructions (Signed)

## 2019-04-15 NOTE — Progress Notes (Signed)
   TELEHEALTH VIRTUAL OBSTETRICS VISIT ENCOUNTER NOTE  I connected with Katie Bowers on 04/15/19 at  3:30 PM EST by telephone at home and verified that I am speaking with the correct person using two identifiers.   I discussed the limitations, risks, security and privacy concerns of performing an evaluation and management service by telephone and the availability of in person appointments. I also discussed with the patient that there may be a patient responsible charge related to this service. The patient expressed understanding and agreed to proceed.  Subjective:  Katie Bowers is a 29 y.o. G2P0010 at [redacted]w[redacted]d being followed for ongoing prenatal care.  She is currently monitored for the following issues for this high-risk pregnancy and has Grave's disease; Pure hypercholesterolemia; Supervision of other normal pregnancy, antepartum; Hyperthyroidism; and H/O herpes genitalis on their problem list.  Patient reports heartburn. Reports fetal movement. Denies any contractions, bleeding or leaking of fluid.   The following portions of the patient's history were reviewed and updated as appropriate: allergies, current medications, past family history, past medical history, past social history, past surgical history and problem list.   Objective:   General:  Alert, oriented and cooperative.   Mental Status: Normal mood and affect perceived. Normal judgment and thought content.  Rest of physical exam deferred due to type of encounter  Assessment and Plan:  Pregnancy: G2P0010 at [redacted]w[redacted]d 1. Supervision of other normal pregnancy, antepartum Reflux and nausea - promethazine (PHENERGAN) 25 MG tablet; Take 1 tablet (25 mg total) by mouth every 6 (six) hours as needed for nausea or vomiting.  Dispense: 30 tablet; Refill: 2 - pantoprazole (PROTONIX) 40 MG tablet; Take 1 tablet (40 mg total) by mouth daily.  Dispense: 30 tablet; Refill: 2  2. Backache symptom  - Elastic Bandages & Supports (COMFORT FIT  MATERNITY SUPP SM) MISC; Wear as directed.  Dispense: 1 each; Refill: 0  3. Hyperthyroidism Nl TSH this pregnancy, plan to repeat before delivery  Preterm labor symptoms and general obstetric precautions including but not limited to vaginal bleeding, contractions, leaking of fluid and fetal movement were reviewed in detail with the patient.  I discussed the assessment and treatment plan with the patient. The patient was provided an opportunity to ask questions and all were answered. The patient agreed with the plan and demonstrated an understanding of the instructions. The patient was advised to call back or seek an in-person office evaluation/go to [facility]U at [facility] for any urgent or concerning symptoms. Please refer to After Visit Summary for other counseling recommendations.   I provided 11 minutes of non-face-to-face time during this encounter.  Return in about 2 weeks (around 04/29/2019) for virtual.  Future Appointments  Date Time Provider Department Center  05/12/2019  7:40 AM [facility]-M[facility] NURSE WH-MFC MFC-US  05/12/2019  7:45 AM WH-MFC [place] 2 WH-MFCUS MFC-US    Scheryl Darter, MD [facility] for [facility], [facility]

## 2019-04-16 ENCOUNTER — Telehealth: Payer: Medicaid Other | Admitting: Obstetrics and Gynecology

## 2019-04-17 DIAGNOSIS — O339 Maternal care for disproportion, unspecified: Secondary | ICD-10-CM | POA: Diagnosis not present

## 2019-04-29 ENCOUNTER — Telehealth (INDEPENDENT_AMBULATORY_CARE_PROVIDER_SITE_OTHER): Payer: Medicaid Other | Admitting: Obstetrics and Gynecology

## 2019-04-29 ENCOUNTER — Encounter: Payer: Self-pay | Admitting: Obstetrics and Gynecology

## 2019-04-29 DIAGNOSIS — O99283 Endocrine, nutritional and metabolic diseases complicating pregnancy, third trimester: Secondary | ICD-10-CM

## 2019-04-29 DIAGNOSIS — E0500 Thyrotoxicosis with diffuse goiter without thyrotoxic crisis or storm: Secondary | ICD-10-CM | POA: Diagnosis not present

## 2019-04-29 DIAGNOSIS — Z3A33 33 weeks gestation of pregnancy: Secondary | ICD-10-CM | POA: Diagnosis not present

## 2019-04-29 DIAGNOSIS — Z8619 Personal history of other infectious and parasitic diseases: Secondary | ICD-10-CM

## 2019-04-29 DIAGNOSIS — E0590 Thyrotoxicosis, unspecified without thyrotoxic crisis or storm: Secondary | ICD-10-CM

## 2019-04-29 DIAGNOSIS — Z3480 Encounter for supervision of other normal pregnancy, unspecified trimester: Secondary | ICD-10-CM

## 2019-04-29 NOTE — Progress Notes (Signed)
TELEHEALTH OBSTETRICS PRENATAL VIRTUAL VIDEO VISIT ENCOUNTER NOTE  Provider location: [facility] for [facility] [place]   I connected with Katie Bowers on 04/29/19 at  9:30 AM EST by MyChart Video Encounter at home and verified that I am speaking with the correct person using two identifiers.   I discussed the limitations, risks, security and privacy concerns of performing an evaluation and management service virtually and the availability of in person appointments. I also discussed with the patient that there may be a patient responsible charge related to this service. The patient expressed understanding and agreed to proceed. Subjective:  Katie Bowers is a 29 y.o. G2P0010 at [redacted]w[redacted]d being seen today for ongoing prenatal care.  She is currently monitored for the following issues for this high-risk pregnancy and has Grave's disease; Pure hypercholesterolemia; Supervision of other normal pregnancy, antepartum; Hyperthyroidism; and H/O herpes genitalis on their problem list.  Patient reports no complaints.  Contractions: Not present. Vag. Bleeding: None.  Movement: Present. Denies any leaking of fluid.   The following portions of the patient's history were reviewed and updated as appropriate: allergies, current medications, past family history, past medical history, past social history, past surgical history and problem list.   Objective:  There were no vitals filed for this visit.  Fetal Status:     Movement: Present     General:  Alert, oriented and cooperative. Patient is in no acute distress.  Respiratory: Normal respiratory effort, no problems with respiration noted  Mental Status: Normal mood and affect. Normal behavior. Normal judgment and thought content.  Rest of physical exam deferred due to type of encounter  Imaging: [place] MFM OB FOLLOW UP  Result Date: 04/10/2019 ----------------------------------------------------------------------  OBSTETRICS REPORT                        (Signed Final 04/10/2019 02:06 pm) ---------------------------------------------------------------------- Patient Info  ID #:       [id number]                          D.O.B.:  06-12-1990 (28 yrs)  Name:       Katie Bowers                  Visit Date: 04/10/2019 01:03 pm ---------------------------------------------------------------------- Performed By  Performed By:     Tommie Raymond BS,       Ref. Address:     [address], [address]                                                             Road                                                             [phone]  [place]                                                             [place]  Attending:        Tama High MD        [facility]  Referred By:      [facility] ---------------------------------------------------------------------- Orders   #  Description                          Code         Ordered By   1  [place] MFM OB FOLLOW UP                  15400.86     Sander Nephew  ----------------------------------------------------------------------   #  Order #                    Accession #                 Episode #   1  [id number]                  [id number]                  [id number]  ---------------------------------------------------------------------- Indications   [redacted] weeks gestation of pregnancy                Z3A.30   Hyperthyroid                                   O99.280 E05.90   Encounter for other antenatal screening        Z36.2   follow-up   Anemia during pregnancy in third trimester     O99.013   Encounter for other antenatal screening        Z36.2   follow-up (LR NIPS/ Negative Horizon)  ---------------------------------------------------------------------- Fetal Evaluation  Num Of Fetuses:         1  Fetal  Heart Rate(bpm):  119  Cardiac Activity:       Observed  Presentation:           Cephalic  Placenta:               Posterior  P. Cord Insertion:      Previously Visualized  Amniotic Fluid  AFI FV:      Within normal limits  AFI Sum(cm)     %Tile  Largest Pocket(cm)  10.9            21          3.93  RUQ(cm)       RLQ(cm)       LUQ(cm)        LLQ(cm)  2.88          2.75          1.34           3.93 ---------------------------------------------------------------------- Biometry  BPD:      82.8  mm     G. Age:  33w 2d         98  %    CI:        77.79   %    70 - 86                                                          FL/HC:      20.9   %    19.2 - 21.4  HC:      297.1  mm     G. Age:  32w 6d         85  %    HC/AC:      1.02        0.99 - 1.21  AC:       292   mm     G. Age:  33w 1d         98  %    FL/BPD:     74.9   %    71 - 87  FL:         62  mm     G. Age:  32w 1d         84  %    FL/AC:      21.2   %    20 - 24  CER:        42  mm     G. Age:  36w 0d       > 95  %  LV:          3  mm  Est. FW:    2067  gm      4 lb 9 oz     99  % ---------------------------------------------------------------------- OB History  Gravidity:    2         Term:   0        Prem:   0        SAB:   0  TOP:          1       Ectopic:  0        Living: 0 ---------------------------------------------------------------------- Gestational Age  LMP:           30w 2d        Date:  09/10/18                 EDD:   06/17/19  U/S Today:     32w 6d                                        EDD:  05/30/19  Best:          30w 2d     Det. By:  LMP  (09/10/18)          EDD:   06/17/19 ---------------------------------------------------------------------- Anatomy  Cranium:               Appears normal         Aortic Arch:            Previously seen  Cavum:                 Appears normal         Ductal Arch:            Previously seen  Ventricles:            Appears normal         Diaphragm:              Appears normal  Choroid Plexus:         Previously seen        Stomach:                Appears normal, left                                                                        sided  Cerebellum:            Previously seen        Abdomen:                Previously seen  Posterior Fossa:       Previously seen        Abdominal Wall:         Previously seen  Nuchal Fold:           Not applicable (>20    Cord Vessels:           Previously seen                         wks GA)  Face:                  Orbits and profile     Kidneys:                Appear normal                         previously seen  Lips:                  Previously seen        Bladder:                Appears normal  Thoracic:              Previously seen        Spine:                  Previously seen  Heart:                 Appears normal         Upper Extremities:      Previously seen                         (  4CH, axis, and                         situs)  RVOT:                  Previously seen        Lower Extremities:      Previously seen  LVOT:                  Previously seen  Other:  Female gender previously seen.  Nasal bone previously seen. Heels and          5th digit previously seen. ---------------------------------------------------------------------- Cervix Uterus Adnexa  Cervix  Not visualized (advanced GA >24wks)  Uterus  No abnormality visualized.  Left Ovary  Within normal limits. No adnexal mass visualized.  Right Ovary  Within normal limits. No adnexal mass visualized.  Cul De Sac  No free fluid seen.  Adnexa  No abnormality visualized. ---------------------------------------------------------------------- Impression  Patient with hyperthyroidism (now euthyroid) returns for fetal  growth assessment.  She does not take any antithyroid  drugs.  On ultrasound, the estimated fetal weight is at the 99th  percentile.  Amniotic fluid is normal good fetal activity seen.  I informed the patient that ultrasound has limitations and  accurately estimating fetal weights. Patient does not  have  gestational diabetes. ---------------------------------------------------------------------- Recommendations  -An appointment was made for her to return in 4 weeks for  fetal growth assessment. ----------------------------------------------------------------------                  Noralee Space, MD Electronically Signed Final Report   04/10/2019 02:06 pm ----------------------------------------------------------------------   Assessment and Plan:  Pregnancy: G2P0010 at [redacted]w[redacted]d 1. 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 Follow up growth 3/16  2. Hyperthyroidism Euthyroid not on medication  3. H/O herpes genitalis Prophylaxis at 36 weeks  Preterm labor symptoms and general obstetric precautions including but not limited to vaginal bleeding, contractions, leaking of fluid and fetal movement were reviewed in detail with the patient. I discussed the assessment and treatment plan with the patient. The patient was provided an opportunity to ask questions and all were answered. The patient agreed with the plan and demonstrated an understanding of the instructions. The patient was advised to call back or seek an in-person office evaluation/go to MAU at [facility] for any urgent or concerning symptoms. Please refer to After Visit Summary for other counseling recommendations.   I provided 11 minutes of face-to-face time during this encounter.  No follow-ups on file.  Future Appointments  Date Time Provider Department Center  04/29/2019  9:30 AM Amely Voorheis, Gigi Gin, MD [facility] None  05/12/2019  7:40 AM [facility]-M[facility] NURSE WH-MFC MFC-US  05/12/2019  7:45 AM [facility] [place] 2 WH-MFCUS MFC-US    Catalina Antigua, MD Center for [employer], [facility]

## 2019-04-29 NOTE — Progress Notes (Signed)
S/w pt for virtual visit, pt reports fetal movement, denies pain. Pt is at work and does not have BP cuff today.

## 2019-05-12 ENCOUNTER — Other Ambulatory Visit (HOSPITAL_COMMUNITY): Payer: Self-pay | Admitting: *Deleted

## 2019-05-12 ENCOUNTER — Encounter (HOSPITAL_COMMUNITY): Payer: Self-pay

## 2019-05-12 ENCOUNTER — Ambulatory Visit (HOSPITAL_COMMUNITY): Payer: Medicaid Other | Admitting: *Deleted

## 2019-05-12 ENCOUNTER — Ambulatory Visit (HOSPITAL_COMMUNITY)
Admission: RE | Admit: 2019-05-12 | Discharge: 2019-05-12 | Disposition: A | Discharge status: Home / Self Care | Payer: Medicaid Other | Source: Ambulatory Visit | Attending: Obstetrics and Gynecology | Admitting: Obstetrics and Gynecology

## 2019-05-12 ENCOUNTER — Other Ambulatory Visit: Payer: Self-pay

## 2019-05-12 VITALS — BP 113/78 | HR 99 | Temp 97.5°F

## 2019-05-12 DIAGNOSIS — E0590 Thyrotoxicosis, unspecified without thyrotoxic crisis or storm: Secondary | ICD-10-CM

## 2019-05-12 DIAGNOSIS — O99280 Endocrine, nutritional and metabolic diseases complicating pregnancy, unspecified trimester: Secondary | ICD-10-CM | POA: Diagnosis present

## 2019-05-12 DIAGNOSIS — Z362 Encounter for other antenatal screening follow-up: Secondary | ICD-10-CM | POA: Diagnosis not present

## 2019-05-12 DIAGNOSIS — O99013 Anemia complicating pregnancy, third trimester: Secondary | ICD-10-CM

## 2019-05-12 DIAGNOSIS — O99283 Endocrine, nutritional and metabolic diseases complicating pregnancy, third trimester: Secondary | ICD-10-CM

## 2019-05-12 DIAGNOSIS — O3660X0 Maternal care for excessive fetal growth, unspecified trimester, not applicable or unspecified: Secondary | ICD-10-CM

## 2019-05-12 DIAGNOSIS — Z3A34 34 weeks gestation of pregnancy: Secondary | ICD-10-CM

## 2019-05-19 ENCOUNTER — Other Ambulatory Visit (HOSPITAL_COMMUNITY)
Admission: RE | Admit: 2019-05-19 | Discharge: 2019-05-19 | Disposition: A | Discharge status: Home / Self Care | Payer: Medicaid Other | Source: Ambulatory Visit | Attending: Certified Nurse Midwife | Admitting: Certified Nurse Midwife

## 2019-05-19 ENCOUNTER — Ambulatory Visit (INDEPENDENT_AMBULATORY_CARE_PROVIDER_SITE_OTHER): Payer: Medicaid Other | Admitting: Certified Nurse Midwife

## 2019-05-19 ENCOUNTER — Encounter: Payer: Self-pay | Admitting: Certified Nurse Midwife

## 2019-05-19 ENCOUNTER — Other Ambulatory Visit: Payer: Self-pay

## 2019-05-19 VITALS — BP 117/78 | HR 91 | Wt 160.0 lb

## 2019-05-19 DIAGNOSIS — Z3480 Encounter for supervision of other normal pregnancy, unspecified trimester: Secondary | ICD-10-CM | POA: Insufficient documentation

## 2019-05-19 DIAGNOSIS — Z3A35 35 weeks gestation of pregnancy: Secondary | ICD-10-CM

## 2019-05-19 DIAGNOSIS — O99283 Endocrine, nutritional and metabolic diseases complicating pregnancy, third trimester: Secondary | ICD-10-CM

## 2019-05-19 DIAGNOSIS — Z8619 Personal history of other infectious and parasitic diseases: Secondary | ICD-10-CM

## 2019-05-19 DIAGNOSIS — E0590 Thyrotoxicosis, unspecified without thyrotoxic crisis or storm: Secondary | ICD-10-CM

## 2019-05-19 MED ORDER — VALACYCLOVIR HCL 500 MG PO TABS: 500.0000 mg | ORAL_TABLET | Freq: Two times a day (BID) | ORAL | 1 refills | Status: DC

## 2019-05-19 NOTE — Progress Notes (Signed)
   PRENATAL VISIT NOTE  Subjective:  Katie Bowers is a 29 y.o. G2P0010 at [redacted]w[redacted]d being seen today for ongoing prenatal care.  She is currently monitored for the following issues for this low-risk pregnancy and has Grave's disease; Pure hypercholesterolemia; Supervision of other normal pregnancy, antepartum; Hyperthyroidism; and H/O herpes genitalis on their problem list.  Patient reports no complaints.  Contractions: Not present. Vag. Bleeding: None.  Movement: Present. Denies leaking of fluid.   The following portions of the patient's history were reviewed and updated as appropriate: allergies, current medications, past family history, past medical history, past social history, past surgical history and problem list.   Objective:   Vitals:   05/19/19 1547  BP: 117/78  Pulse: 91  Weight: 160 lb (72.6 kg)    Fetal Status: Fetal Heart Rate (bpm): 140 Fundal Height: 38 cm Movement: Present  Presentation: Vertex  General:  Alert, oriented and cooperative. Patient is in no acute distress.  Skin: Skin is warm and dry. No rash noted.   Cardiovascular: Normal heart rate noted  Respiratory: Normal respiratory effort, no problems with respiration noted  Abdomen: Soft, gravid, appropriate for gestational age.  Pain/Pressure: Present     Pelvic: Cervical exam performed in the presence of a chaperone Dilation: Closed Effacement (%): Thick Station: -3  Extremities: Normal range of motion.  Edema: None  Mental Status: Normal mood and affect. Normal behavior. Normal judgment and thought content.   Assessment and Plan:  Pregnancy: G2P0010 at [redacted]w[redacted]d 1. Supervision of other normal pregnancy, antepartum - patient doing well, no complaints - Routine prenatal care - Anticipatory guidance on upcoming appointments with next being MyChart appointments - Preterm labor precautions discussed and reasons to present to MAU  - Patient is interested in waterbirth, discussed with patient needed class to be able  to have waterbirth- next class scheduled for 06/03/19 - Discussed with patient to bring completion certificate to office with her or send through mychart  - Discussed contraindications to waterbirth and answered patient's questions  - Est. FW:    3392  gm    7 lb 8 oz   > 99  % @ 34.6 weeks  - Cervicovaginal ancillary only( [place]) - Culture, beta strep (group b only)  2. H/O herpes genitalis - No current or recent outbreak, suppression initiated  - valACYclovir (VALTREX) 500 MG tablet; Take 1 tablet (500 mg total) by mouth 2 (two) times daily.  Dispense: 60 tablet; Refill: 1  3. Hyperthyroidism - currently not on medication   Preterm labor symptoms and general obstetric precautions including but not limited to vaginal bleeding, contractions, leaking of fluid and fetal movement were reviewed in detail with the patient. Please refer to After Visit Summary for other counseling recommendations.   Return in about 1 week (around 05/26/2019) for ROB-mychart with midwife .  Future Appointments  Date Time Provider Department Center  05/28/2019  9:15 AM Currie Paris, NP [facility] None  06/03/2019 12:45 PM [facility] NURSE WH-MFC MFC-US  06/03/2019 12:45 PM [facility] [place] 2 WH-MFCUS MFC-US  06/03/2019  2:20 PM Sharyon Cable, CNM [facility] None    Sharyon Cable, CNM

## 2019-05-19 NOTE — Progress Notes (Signed)
Patient reports fetal movement with a little pressure.

## 2019-05-19 NOTE — Patient Instructions (Signed)
Considering Waterbirth? Guide for patients at [facility] for [employer]  Why consider waterbirth?  . Gentle birth for babies . Less pain medicine used in labor . May allow for passive descent/less pushing . May reduce perineal tears  . More mobility and instinctive maternal position changes . Increased maternal relaxation . Reduced blood pressure in labor  Is waterbirth safe? What are the risks of infection, drowning or other complications?  . Infection: o Very low risk (3.7 % for tub vs 4.8% for bed) o 7 in 8000 waterbirths with documented infection o Poorly cleaned equipment most common cause o Slightly lower group B strep transmission rate  . Drowning o Maternal:  - Very low risk   - Related to seizures or fainting o Newborn:  - Very low risk. No evidence of increased risk of respiratory problems in multiple large studies - Physiological protection from breathing under water - Avoid underwater birth if there are any fetal complications - Once baby's head is out of the water, keep it out.  . Birth complication o Some reports of cord trauma, but risk decreased by bringing baby to surface gradually o No evidence of increased risk of shoulder dystocia. Mothers can usually change positions faster in water than in a bed, possibly aiding the maneuvers to free the shoulder.   You must attend a Doren Custard class at [facility]  3rd Wednesday of every month from 7-9pm  [employer] by calling [phone] or online at [email or web address]  Bring [place] the certificate from the class to your prenatal appointment  Meet with a midwife at 36 weeks to see if you can still plan a waterbirth and to sign the consent.   If you plan a waterbirth at [facility] and [facility], you will need to purchase the following:  Fish net  Bathing suit top (optional)  Long-handled mirror (optional)  Places to purchase or rent supplies:   [employer]  for tub purchases and supplies  Waterbirthsolutions.com for tub purchases and supplies  [employer] (www.thelaborladies.com) $275 for tub rental/set-up & take down/kit   [employer] (http://www.fleming.com/.htm) Information regarding doulas (labor support) who provide pool rentals  Things that would prevent you from having a waterbirth:  Premature, <37wks  Previous cesarean birth  Presence of thick meconium-stained fluid  Multiple gestation (Twins, triplets, etc.)  Uncontrolled diabetes or gestational diabetes requiring medication  Hypertension requiring medication or diagnosis of pre-eclampsia  Heavy vaginal bleeding  Non-reassuring fetal heart rate  Active infection (MRSA, etc.). Group B Strep is NOT a contraindication for waterbirth.  If your labor has to be induced and induction method requires continuous monitoring of the baby's heart rate  Other risks/issues identified by your obstetrical provider  Please remember that birth is unpredictable. Under certain unforeseeable circumstances your provider may advise against giving birth in the tub. These decisions will be made on a case-by-case basis and with the safety of you and your baby as our highest priority.   Reasons to go to MAU:  1.  Contractions are  5 minutes apart or less, each last 1 minute, these have been going on for 1-2 hours, and you cannot walk or talk during them 2.  You have a large gush of fluid, or a trickle of fluid that will not stop and you have to wear a pad 3.  You have bleeding that is bright red, heavier than spotting--like menstrual bleeding (spotting can be normal in early labor or after a check  of your cervix) 4.  You do not feel the baby moving like he/she normally does

## 2019-05-20 ENCOUNTER — Telehealth: Payer: Self-pay | Admitting: *Deleted

## 2019-05-20 NOTE — Telephone Encounter (Signed)
Attempt to contact pt regarding FMLA forms. LM on VM to call office.

## 2019-05-21 LAB — CERVICOVAGINAL ANCILLARY ONLY
Chlamydia: NEGATIVE
Comment: NEGATIVE
Comment: NORMAL
Neisseria Gonorrhea: NEGATIVE

## 2019-05-22 DIAGNOSIS — Z3493 Encounter for supervision of normal pregnancy, unspecified, third trimester: Secondary | ICD-10-CM

## 2019-05-23 LAB — CULTURE, BETA STREP (GROUP B ONLY): Strep Gp B Culture: NEGATIVE

## 2019-05-28 ENCOUNTER — Telehealth (INDEPENDENT_AMBULATORY_CARE_PROVIDER_SITE_OTHER): Payer: Medicaid Other | Admitting: Nurse Practitioner

## 2019-05-28 ENCOUNTER — Encounter: Payer: Self-pay | Admitting: Nurse Practitioner

## 2019-05-28 DIAGNOSIS — Z3A37 37 weeks gestation of pregnancy: Secondary | ICD-10-CM | POA: Diagnosis not present

## 2019-05-28 DIAGNOSIS — Z3480 Encounter for supervision of other normal pregnancy, unspecified trimester: Secondary | ICD-10-CM

## 2019-05-28 DIAGNOSIS — Z8619 Personal history of other infectious and parasitic diseases: Secondary | ICD-10-CM | POA: Diagnosis not present

## 2019-05-28 NOTE — Patient Instructions (Signed)

## 2019-05-28 NOTE — Progress Notes (Signed)
I connected with@ on 05/28/19 at  9:15 AM EDT by: Mychart and verified that I am speaking with the correct person using two identifiers.  Patient is located at work and provider is located at [facility].     The purpose of this virtual visit is to provide medical care while limiting exposure to the novel coronavirus. I discussed the limitations, risks, security and privacy concerns of performing an evaluation and management service by Mychart and the availability of in person appointments. I also discussed with the patient that there may be a patient responsible charge related to this service. By engaging in this virtual visit, you consent to the provision of healthcare.  Additionally, you authorize for your insurance to be billed for the services provided during this visit.  The patient expressed understanding and agreed to proceed.  The following staff members participated in the virtual visit:  Margaretmary Eddy, CMA and Nolene Bernheim, NP:    PRENATAL VISIT NOTE  Subjective:  Katie Bowers is a 29 y.o. G2P0010 at [redacted]w[redacted]d  for phone visit for ongoing prenatal care.  She is currently monitored for the following issues for this low-risk pregnancy and has Grave's disease; Pure hypercholesterolemia; Supervision of other normal pregnancy, antepartum; Hyperthyroidism; and H/O herpes genitalis on their problem list.  Patient reports occasional contractions.  Contractions: Not present. Vag. Bleeding: None.  Movement: Present. Denies leaking of fluid.   The following portions of the patient's history were reviewed and updated as appropriate: allergies, current medications, past family history, past medical history, past social history, past surgical history and problem list.   Objective:  There were no vitals filed for this visit. Self-Obtained  Fetal Status:     Movement: Present     Assessment and Plan:  Pregnancy: G2P0010 at [redacted]w[redacted]d 1. Supervision of other normal pregnancy, antepartum Doing well and  baby is moving well Is scheduled to attend waterbirth class next week  2.  Hx of HSV On valtrex  Term labor symptoms and general obstetric precautions including but not limited to vaginal bleeding, contractions, leaking of fluid and fetal movement were reviewed in detail with the patient.  Return in about 1 week (around 06/04/2019) for as scheduled.  Future Appointments  Date Time Provider Department Center  06/03/2019 12:45 PM [facility] NURSE [facility]FC-US  06/03/2019 12:45 PM [facility] [place] 2 WH-MFCUS MFC-US  06/03/2019  2:20 PM Sharyon Cable, CNM [facility] None     Time spent on virtual visit: 7 minutes  Currie Paris, NP

## 2019-05-28 NOTE — Progress Notes (Signed)
Virtual ROB   CC: None   Pt unable to check B/P due to being work.

## 2019-06-03 ENCOUNTER — Encounter: Payer: Self-pay | Admitting: Certified Nurse Midwife

## 2019-06-03 ENCOUNTER — Encounter (HOSPITAL_COMMUNITY): Payer: Self-pay

## 2019-06-03 ENCOUNTER — Telehealth (INDEPENDENT_AMBULATORY_CARE_PROVIDER_SITE_OTHER): Payer: Medicaid Other | Admitting: Certified Nurse Midwife

## 2019-06-03 ENCOUNTER — Ambulatory Visit (HOSPITAL_COMMUNITY): Payer: Medicaid Other | Admitting: *Deleted

## 2019-06-03 ENCOUNTER — Other Ambulatory Visit: Payer: Self-pay

## 2019-06-03 ENCOUNTER — Ambulatory Visit (HOSPITAL_COMMUNITY)
Admission: RE | Admit: 2019-06-03 | Discharge: 2019-06-03 | Disposition: A | Discharge status: Home / Self Care | Payer: Medicaid Other | Source: Ambulatory Visit | Attending: Obstetrics and Gynecology | Admitting: Obstetrics and Gynecology

## 2019-06-03 VITALS — BP 125/78

## 2019-06-03 VITALS — BP 125/78 | HR 87 | Temp 98.4°F

## 2019-06-03 DIAGNOSIS — O99283 Endocrine, nutritional and metabolic diseases complicating pregnancy, third trimester: Secondary | ICD-10-CM | POA: Diagnosis not present

## 2019-06-03 DIAGNOSIS — O0990 Supervision of high risk pregnancy, unspecified, unspecified trimester: Secondary | ICD-10-CM | POA: Diagnosis present

## 2019-06-03 DIAGNOSIS — Z3480 Encounter for supervision of other normal pregnancy, unspecified trimester: Secondary | ICD-10-CM

## 2019-06-03 DIAGNOSIS — E0590 Thyrotoxicosis, unspecified without thyrotoxic crisis or storm: Secondary | ICD-10-CM | POA: Insufficient documentation

## 2019-06-03 DIAGNOSIS — O3663X0 Maternal care for excessive fetal growth, third trimester, not applicable or unspecified: Secondary | ICD-10-CM | POA: Diagnosis not present

## 2019-06-03 DIAGNOSIS — Z3A38 38 weeks gestation of pregnancy: Secondary | ICD-10-CM | POA: Diagnosis not present

## 2019-06-03 DIAGNOSIS — O99013 Anemia complicating pregnancy, third trimester: Secondary | ICD-10-CM

## 2019-06-03 DIAGNOSIS — E030 Congenital hypothyroidism with diffuse goiter: Secondary | ICD-10-CM

## 2019-06-03 DIAGNOSIS — Z8619 Personal history of other infectious and parasitic diseases: Secondary | ICD-10-CM

## 2019-06-03 DIAGNOSIS — O3660X0 Maternal care for excessive fetal growth, unspecified trimester, not applicable or unspecified: Secondary | ICD-10-CM

## 2019-06-03 NOTE — Progress Notes (Signed)
Patient reports fetal movement with irregular contractions. 

## 2019-06-03 NOTE — Progress Notes (Signed)
OBSTETRICS PRENATAL VIRTUAL VISIT ENCOUNTER NOTE  Provider location: [facility] [place]   I connected with Katie Bowers on 06/03/19 at  2:20 PM EDT by MyChart Video Encounter at home and verified that I am speaking with the correct person using two identifiers.   I discussed the limitations, risks, security and privacy concerns of performing an evaluation and management service virtually and the availability of in person appointments. I also discussed with the patient that there may be a patient responsible charge related to this service. The patient expressed understanding and agreed to proceed. Subjective:  Katie Bowers is a 29 y.o. G2P0010 at [redacted]w[redacted]d being seen today for ongoing prenatal care.  She is currently monitored for the following issues for this low-risk pregnancy and has Grave's disease; Pure hypercholesterolemia; Supervision of other normal pregnancy, antepartum; Hyperthyroidism; and H/O herpes genitalis on their problem list.  Patient reports occasional contractions.  Contractions: Irregular. Vag. Bleeding: None.  Movement: Present. Denies any leaking of fluid.   The following portions of the patient's history were reviewed and updated as appropriate: allergies, current medications, past family history, past medical history, past social history, past surgical history and problem list.   Objective:   Vitals:   06/03/19 1345  BP: 125/78    Fetal Status:     Movement: Present     General:  Alert, oriented and cooperative. Patient is in no acute distress.  Respiratory: Normal respiratory effort, no problems with respiration noted  Mental Status: Normal mood and affect. Normal behavior. Normal judgment and thought content.  Rest of physical exam deferred due to type of encounter  Imaging: [place] MFM OB FOLLOW UP  Result Date: 06/03/2019 ----------------------------------------------------------------------  OBSTETRICS REPORT                       (Signed  Final 06/03/2019 01:52 pm) ---------------------------------------------------------------------- Patient Info  ID #:       [id number]                          D.O.B.:  [birth year] (29 yrs)  Name:       Katie Bowers                  Visit Date: 06/03/2019 12:02 pm ---------------------------------------------------------------------- Performed By  Performed By:     Hurman Horn          Ref. Address:     [address]                                                             [phone]  [place]                                                             [address]  Attending:        Ma Rings MD         [facility]  Referred By:      [facility] ---------------------------------------------------------------------- Orders   #  Description                          Code         Ordered By   1  [place] MFM OB FOLLOW UP                  [phone]     RA[facility]  ----------------------------------------------------------------------   #  Order #                    Accession #                 Episode #   1  [id number]                  [id number]                  [id number]  ---------------------------------------------------------------------- Indications   Large for gestational age fetus affecting      O36.60X0   management of mother   [redacted] weeks gestation of pregnancy                Z3A.38   Hyperthyroid (Grave's disease)                 O99.280 E05.90   Anemia during pregnancy in third trimester     O99.013   Low Risk NIPS (Negative Horizon)  ---------------------------------------------------------------------- Fetal Evaluation  Num Of Fetuses:         1  Fetal Heart Rate(bpm):  164  Cardiac Activity:       Observed  Presentation:           Cephalic  Placenta:                Posterior  P. Cord Insertion:      Visualized  Amniotic Fluid  AFI FV:      Within normal limits  AFI Sum(cm)     %Tile       Largest Pocket(cm)  19.35           76          9.06  RUQ(cm)       RLQ(cm)       LUQ(cm)        LLQ(cm)  9.06          1.59          5.3  3.4 ---------------------------------------------------------------------- Biometry  BPD:      96.4  mm     G. Age:  39w 3d         95  %    CI:        79.18   %    70 - 86                                                          FL/HC:      22.1   %    20.9 - 22.7  HC:      342.5  mm     G. Age:  39w 4d         67  %    HC/AC:      0.90        0.92 - 1.05  AC:      380.4  mm     G. Age:  42w 0d       > 99  %    FL/BPD:     78.6   %    71 - 87  FL:       75.8  mm     G. Age:  38w 5d         72  %    FL/AC:      19.9   %    20 - 24  HUM:      61.6  mm     G. Age:  35w 5d         34  %  Est. FW:    4181  gm      9 lb 3 oz     99  % ---------------------------------------------------------------------- OB History  Gravidity:    2         Term:   0        Prem:   0        SAB:   0  TOP:          1       Ectopic:  0        Living: 0 ---------------------------------------------------------------------- Gestational Age  LMP:           38w 0d        Date:  09/10/18                 EDD:   06/17/19  U/S Today:     40w 0d                                        EDD:   06/03/19  Best:          38w 0d     Det. By:  LMP  (09/10/18)          EDD:   06/17/19 ---------------------------------------------------------------------- Anatomy  Cranium:               Appears normal         Aortic Arch:            Previously seen  Cavum:                 Appears normal  Ductal Arch:            Previously seen  Ventricles:            Appears normal         Diaphragm:              Appears normal  Choroid Plexus:        Previously seen        Stomach:                Appears normal, left                                                                        sided   Cerebellum:            Previously seen        Abdomen:                Appears normal  Posterior Fossa:       Previously seen        Abdominal Wall:         Previously seen  Nuchal Fold:           Not applicable (>20    Cord Vessels:           Previously seen                         wks GA)  Face:                  Orbits and profile     Kidneys:                Appear normal                         previously seen  Lips:                  Previously seen        Bladder:                Appears normal  Thoracic:              Appears normal         Spine:                  Previously seen  Heart:                 Previously seen        Upper Extremities:      Previously seen  RVOT:                  Previously seen        Lower Extremities:      Previously seen  LVOT:                  Previously seen  Other:  Female gender previously seen.  Nasal bone previously seen. Heels and          5th digit previously seen. ---------------------------------------------------------------------- Comments  This patient was seen for a follow up growth scan due to a  history of hyperthyroidism.  She reports that  she has  screened negative for gestational diabetes in her current  pregnancy and denies any problems since her last exam.  She was informed that the fetal growth continues to measure  greater than the 99th percentile for her gestational age.  The  amniotic fluid level was within normal limits.  She was advised that as she has screened negative for  gestational diabetes, a vaginal delivery may be attempted  should she desire.  No further exams were scheduled in our office. ----------------------------------------------------------------------                   Johnell Comings, MD Electronically Signed Final Report   06/03/2019 01:52 pm ----------------------------------------------------------------------  [place] MFM OB FOLLOW UP  Result Date: 05/12/2019 ----------------------------------------------------------------------  OBSTETRICS  REPORT                       (Signed Final 05/12/2019 08:29 am) ---------------------------------------------------------------------- Patient Info  ID #:       [id number]                          D.O.B.:  April 18, 1990 (28 yrs)  Name:       [name]                  Visit Date: 05/12/2019 07:50 am ---------------------------------------------------------------------- Performed By  Performed By:     Jeanene Erb BS,      Ref. Address:     [place]                                                             [place]                                                             [place]  Attending:        Tama High MD        [facility]  Referred By:      [facility] ---------------------------------------------------------------------- Orders   #  Description                          Code         Ordered By   1  [place] MFM OB FOLLOW UP                  [id number]     RAVI [facility]  ----------------------------------------------------------------------   #  Order #                    Accession #                 Episode #   1  [id number]                  [id number]                  [id number]  ---------------------------------------------------------------------- Indications   Large for gestational age fetus affecting      O36.60X0   management of mother   Encounter for other antenatal screening        Z36.2   follow-up   [redacted] weeks gestation of pregnancy                Z3A.34   Hyperthyroid (Grave's disease)                 O99.280 E05.90   Anemia during pregnancy in third trimester     O99.013   Low Risk NIPS (Negative Horizon)  ---------------------------------------------------------------------- Fetal Evaluation  Num Of Fetuses:         1  Fetal Heart Rate(bpm):   159  Cardiac Activity:       Observed  Presentation:           Cephalic  Placenta:               Posterior  P. Cord Insertion:      Previously Visualized  Amniotic Fluid  AFI FV:      Within normal limits  AFI Sum(cm)     %Tile       Largest Pocket(cm)  14.23           51          5.1  RUQ(cm)       RLQ(cm)       LUQ(cm)        LLQ(cm)  4.46          2.15          2.52           5.1 ---------------------------------------------------------------------- Biometry  BPD:      92.4  mm     G. Age:  37w 4d         98  %    CI:        75.82   %    70 - 86                                                          FL/HC:      21.1   %    20.1 - 22.3  HC:  336.4  mm     G. Age:  38w 4d         94  %    HC/AC:      0.96        0.93 - 1.11  AC:      348.9  mm     G. Age:  38w 6d       > 99  %    FL/BPD:     76.9   %    71 - 87  FL:       71.1  mm     G. Age:  36w 3d         81  %    FL/AC:      20.4   %    20 - 24  Est. FW:    3392  gm      7 lb 8 oz   > 99  % ---------------------------------------------------------------------- OB History  Gravidity:    2         Term:   0        Prem:   0        SAB:   0  TOP:          1       Ectopic:  0        Living: 0 ---------------------------------------------------------------------- Gestational Age  LMP:           34w 6d        Date:  09/10/18                 EDD:   06/17/19  U/S Today:     37w 6d                                        EDD:   05/27/19  Best:          34w 6d     Det. By:  LMP  (09/10/18)          EDD:   06/17/19 ---------------------------------------------------------------------- Anatomy  Cranium:               Appears normal         Aortic Arch:            Previously seen  Cavum:                 Appears normal         Ductal Arch:            Previously seen  Ventricles:            Appears normal         Diaphragm:              Previously seen  Choroid Plexus:        Previously seen        Stomach:                Appears normal, left  sided  Cerebellum:            Previously seen        Abdomen:                Previously seen  Posterior Fossa:       Previously seen        Abdominal Wall:         Previously seen  Nuchal Fold:           Not applicable (>20    Cord Vessels:           Previously seen                         wks GA)  Face:                  Orbits and profile     Kidneys:                Appear normal                         previously seen  Lips:                  Previously seen        Bladder:                Appears normal  Thoracic:              Previously seen        Spine:                  Previously seen  Heart:                 Previously seen        Upper Extremities:      Previously seen  RVOT:                  Previously seen        Lower Extremities:      Previously seen  LVOT:                  Previously seen  Other:  Female gender previously seen.  Nasal bone previously seen. Heels and          5th digit previously seen. ---------------------------------------------------------------------- Cervix Uterus Adnexa  Cervix  Not visualized (advanced GA >24wks) ---------------------------------------------------------------------- Impression  Patient has a diagnosis of hyperthyroidism. Euthyroid without  medications.  She does not have gestational diabetes. Patient is very sure  of her LMP date.  On ultrasound, the estimated fetal weight is at greater than  the 99th percentile. Amniotic fluid is normal and good fetal  activity is seen. Cephalic presentation. I explained the  limitations of ultrasound in accurately estimating fetal weights. ---------------------------------------------------------------------- Recommendations  -An appointment was made for her to return in 3 weeks for  fetal growth assessment if undelivered. ----------------------------------------------------------------------                  Noralee Space, MD Electronically Signed Final Report   05/12/2019 08:29 am  ----------------------------------------------------------------------   Assessment and Plan:  Pregnancy: G2P0010 at [redacted]w[redacted]d 1. Supervision of other normal pregnancy, antepartum - patient doing well, reports irregular contractions  - Educated and discussed cervical ripening and use of EPO, RRT, and IC to stimulate contractions at home - message sent to patient with details  - Patient reports she  has waterbirth class this evening, encouraged to sent certificate through MyChart once she receives, patient verbalizes understanding  - Educated and discussed with patient that shoulder dystocia is possible with LGA baby, discussed that would need to get you and baby out of the tub if that were to happen  - Routine prenatal care - Anticipatory guidance on upcoming appointments including next appointment in office for membrane sweep.    2. H/O herpes genitalis - Continue valtrex suppression   3. Excessive fetal growth affecting management of pregnancy, antepartum, single or unspecified fetus - follow up US completed today for fetal growth  - Est. FW: 4181 gm    9 lb 3 oz   99  % at 38 weeks    Term labor symptoms and general obstetric precautions including but not limited to vaginal bleeding, contractions, leaking of fluid and fetal movement were reviewed in detail with the patient. I discussed the assessment and treatment plan with the patient. The patient was provided an opportunity to ask questions and all were answered. The patient agreed with the plan and demonstrated an understanding of the instructions. The patient was advised to call back or seek an in-person office evaluation/go to [facility]U at [facility] for any urgent or concerning symptoms. Please refer to After Visit Summary for other counseling recommendations.   I provided 12 minutes of face-to-face time during this encounter.  Return in about 1 week (around 06/10/2019) for ROB.   Sharyon Cable, CNM [facility] [employer]

## 2019-06-07 ENCOUNTER — Encounter: Payer: Self-pay | Admitting: Certified Nurse Midwife

## 2019-06-10 ENCOUNTER — Ambulatory Visit (INDEPENDENT_AMBULATORY_CARE_PROVIDER_SITE_OTHER): Payer: Medicaid Other | Admitting: Advanced Practice Midwife

## 2019-06-10 ENCOUNTER — Other Ambulatory Visit: Payer: Self-pay

## 2019-06-10 ENCOUNTER — Telehealth (HOSPITAL_COMMUNITY): Payer: Self-pay | Admitting: *Deleted

## 2019-06-10 VITALS — BP 116/71 | HR 90 | Wt 161.8 lb

## 2019-06-10 DIAGNOSIS — Z3A39 39 weeks gestation of pregnancy: Secondary | ICD-10-CM

## 2019-06-10 DIAGNOSIS — Z3480 Encounter for supervision of other normal pregnancy, unspecified trimester: Secondary | ICD-10-CM

## 2019-06-10 DIAGNOSIS — O3660X0 Maternal care for excessive fetal growth, unspecified trimester, not applicable or unspecified: Secondary | ICD-10-CM

## 2019-06-10 DIAGNOSIS — Z8619 Personal history of other infectious and parasitic diseases: Secondary | ICD-10-CM

## 2019-06-10 DIAGNOSIS — O3663X0 Maternal care for excessive fetal growth, third trimester, not applicable or unspecified: Secondary | ICD-10-CM

## 2019-06-10 NOTE — Progress Notes (Signed)
ROB, c/o pressure and gush of fluid last night.

## 2019-06-10 NOTE — Patient Instructions (Signed)
Labor Precautions Reasons to come to [facility] at  [facility]:  1.  Contractions are  5 minutes apart or less, each last 1 minute, these have been going on for 1-2 hours, and you cannot walk or talk during them 2.  You have a large gush of fluid, or a trickle of fluid that will not stop and you have to wear a pad 3.  You have bleeding that is bright red, heavier than spotting--like menstrual bleeding (spotting can be normal in early labor or after a check of your cervix) 4.  You do not feel the baby moving like he/she normally does  

## 2019-06-10 NOTE — Progress Notes (Signed)
   PRENATAL VISIT NOTE  Subjective:  Katie Bowers is a 29 y.o. G2P0010 at [redacted]w[redacted]d being seen today for ongoing prenatal care.  She is currently monitored for the following issues for this low-risk pregnancy and has Grave's disease; Pure hypercholesterolemia; Supervision of other normal pregnancy, antepartum; Hyperthyroidism; H/O herpes genitalis; and LGA (large for gestational age) fetus affecting management of mother on their problem list.  Patient reports no complaints and small gush of fluid last night, none today.  Contractions: Irregular. Vag. Bleeding: None.  Movement: Present. Denies leaking of fluid.   The following portions of the patient's history were reviewed and updated as appropriate: allergies, current medications, past family history, past medical history, past social history, past surgical history and problem list.   Objective:   Vitals:   06/10/19 1400  BP: 116/71  Pulse: 90  Weight: 161 lb 12.8 oz (73.4 kg)    Fetal Status: Fetal Heart Rate (bpm): 131 Fundal Height: 40 cm Movement: Present  Presentation: Vertex  General:  Alert, oriented and cooperative. Patient is in no acute distress.  Skin: Skin is warm and dry. No rash noted.   Cardiovascular: Normal heart rate noted  Respiratory: Normal respiratory effort, no problems with respiration noted  Abdomen: Soft, gravid, appropriate for gestational age.  Pain/Pressure: Present     Pelvic: Cervical exam performed in the presence of a chaperone Dilation: 1 Effacement (%): 50 Station: -2  Extremities: Normal range of motion.  Edema: Trace  Mental Status: Normal mood and affect. Normal behavior. Normal judgment and thought content.   Assessment and Plan:  Pregnancy: G2P0010 at [redacted]w[redacted]d 1. Supervision of other normal pregnancy, antepartum --Anticipatory guidance about next visits/weeks of pregnancy given. --Waterbirth consent signed and witnessed today --SSE with negative pooling and negative ferning on slide taken.  Also  membranes palpable on digital exam so no evidence of ROM today. --Membranes swept at pt request, pt to try the [employer] today and continue taking EPO nightly --Next visit in 1 week for NST, discussed IOL at 41 weeks, pt desires waterbirth but is amenable to induction if needed  2. H/O herpes genitalis --On Valtrex, no s/sx of outbreak  3. Excessive fetal growth affecting management of pregnancy, antepartum, single or unspecified fetus --EFW 4181gm, 9lb 3 oz at 38 weeks --FH wnl today at 40 cm --Leopolds estimate 8.5 lbs today  Term labor symptoms and general obstetric precautions including but not limited to vaginal bleeding, contractions, leaking of fluid and fetal movement were reviewed in detail with the patient. Please refer to After Visit Summary for other counseling recommendations.   Return in about 1 week (around 06/17/2019).  Future Appointments  Date Time Provider Department Center  06/17/2019  1:40 PM Gerrit Heck, CNM [facility] None  06/22/2019  9:45 AM [facility]SCREENING [facility] None  06/24/2019  8:05 AM [facility] SCHED ROOM [facility] None    Sharen Counter, CNM

## 2019-06-10 NOTE — Telephone Encounter (Signed)
Preadmission screen  

## 2019-06-15 ENCOUNTER — Encounter (HOSPITAL_COMMUNITY): Payer: Self-pay | Admitting: Obstetrics and Gynecology

## 2019-06-15 ENCOUNTER — Other Ambulatory Visit: Payer: Self-pay

## 2019-06-15 ENCOUNTER — Inpatient Hospital Stay (EMERGENCY_DEPARTMENT_HOSPITAL)
Admission: AD | Admit: 2019-06-15 | Discharge: 2019-06-15 | Disposition: A | Discharge status: Home / Self Care | Payer: Medicaid Other | Source: Home / Self Care | Attending: Family Medicine | Admitting: Family Medicine

## 2019-06-15 ENCOUNTER — Inpatient Hospital Stay (HOSPITAL_COMMUNITY)
Admission: AD | Admit: 2019-06-15 | Discharge: 2019-06-19 | DRG: 788 | Disposition: A | Discharge status: Home / Self Care | Payer: Medicaid Other | Attending: Family Medicine | Admitting: Family Medicine

## 2019-06-15 ENCOUNTER — Encounter (HOSPITAL_COMMUNITY): Payer: Self-pay | Admitting: Family Medicine

## 2019-06-15 DIAGNOSIS — O99284 Endocrine, nutritional and metabolic diseases complicating childbirth: Secondary | ICD-10-CM | POA: Diagnosis present

## 2019-06-15 DIAGNOSIS — Z3A39 39 weeks gestation of pregnancy: Secondary | ICD-10-CM

## 2019-06-15 DIAGNOSIS — Z20822 Contact with and (suspected) exposure to covid-19: Secondary | ICD-10-CM | POA: Diagnosis present

## 2019-06-15 DIAGNOSIS — Z30430 Encounter for insertion of intrauterine contraceptive device: Secondary | ICD-10-CM

## 2019-06-15 DIAGNOSIS — O471 False labor at or after 37 completed weeks of gestation: Secondary | ICD-10-CM

## 2019-06-15 DIAGNOSIS — Z975 Presence of (intrauterine) contraceptive device: Secondary | ICD-10-CM

## 2019-06-15 DIAGNOSIS — Z3689 Encounter for other specified antenatal screening: Secondary | ICD-10-CM

## 2019-06-15 DIAGNOSIS — E0500 Thyrotoxicosis with diffuse goiter without thyrotoxic crisis or storm: Secondary | ICD-10-CM | POA: Diagnosis present

## 2019-06-15 DIAGNOSIS — Z3480 Encounter for supervision of other normal pregnancy, unspecified trimester: Secondary | ICD-10-CM

## 2019-06-15 DIAGNOSIS — E0590 Thyrotoxicosis, unspecified without thyrotoxic crisis or storm: Secondary | ICD-10-CM | POA: Diagnosis present

## 2019-06-15 DIAGNOSIS — O3663X0 Maternal care for excessive fetal growth, third trimester, not applicable or unspecified: Principal | ICD-10-CM | POA: Diagnosis present

## 2019-06-15 DIAGNOSIS — Z3493 Encounter for supervision of normal pregnancy, unspecified, third trimester: Secondary | ICD-10-CM

## 2019-06-15 DIAGNOSIS — Z0371 Encounter for suspected problem with amniotic cavity and membrane ruled out: Secondary | ICD-10-CM | POA: Insufficient documentation

## 2019-06-15 LAB — POCT FERN TEST: POCT Fern Test: NEGATIVE

## 2019-06-15 NOTE — MAU Provider Note (Signed)
First Provider Initiated Contact with Patient 06/15/19 1056     S: Ms. Katie Bowers is a 29 y.o. G2P0010 at [redacted]w[redacted]d  who presents to MAU today complaining of leaking of fluid since 06/14/2019 at 2100 hours. She denies vaginal bleeding. She endorses irregular contractions. She reports normal fetal movement.  She desires waterbirth.  O: BP 115/80 (BP Location: Right Arm)   Pulse 100   Temp 98.6 F (37 C) (Oral)   Resp 16   LMP 09/10/2018 (Exact Date)   SpO2 97%  GENERAL: Well-developed, well-nourished female in no acute distress.  HEAD: Normocephalic, atraumatic.  CHEST: Normal effort of breathing, regular heart rate ABDOMEN: Soft, nontender, gravid PELVIC: Normal external female genitalia. Vagina is pink and rugated. Cervix with normal contour, no lesions. Negative pooling.   Cervical exam:  Dilation: 1.5 Effacement (%): 80 Station: -2 Presentation: Vertex Exam by:: F. Morris, RNC  Fetal Monitoring: Baseline: 130 Variability: Mod Accelerations: POS 15 x 15 Decelerations: N/A Contractions: Irregular q 2-6, palpate mild  Results for orders placed or performed during the hospital encounter of 06/15/19 (from the past 24 hour(s))  POCT fern test     Status: None   Collection Time: 06/15/19 11:22 AM  Result Value Ref Range   POCT Fern Test Negative = intact amniotic membranes     A: SIUP at [redacted]w[redacted]d  Negative pooling Negative Fern Intact amniotic sac  P: Likely early labor given pain score and contraction pattern Continue [employer] as previously advised Discharge home in stable condition with labor precautions  Clayton Bibles, CNM 06/15/2019 11:54 AM

## 2019-06-15 NOTE — MAU Note (Signed)
Katie Bowers is a 29 y.o. at [redacted]w[redacted]d here in MAU reporting: contractions started this morning, they are getting closer and are now every 6-7 minutes. No bleeding. LOF since yesterday at 2100, and then before she came here she noticed a wet spot on her covers and panties. Last night it was creamy and today it was clear. Is wearing a pad.   Onset of complaint: today  Pain score: 5/10  Vitals:   06/15/19 1016  BP: 102/77  Pulse: (!) 104  Resp: 16  Temp: 98.6 F (37 C)  SpO2: 99%     FHT: +FM  Lab orders placed from triage: none

## 2019-06-15 NOTE — Discharge Instructions (Signed)
First Stage of Labor °Labor is your body's natural process of moving your baby and other structures, including the placenta and umbilical cord, out of your uterus. There are three stages of labor. How long each stage lasts is different for every woman. But certain events happen during each stage that are the same for everyone. °· The first stage starts when true labor begins. This stage ends when your cervix, which is the opening from your uterus into your vagina, is completely open (dilated). °· The second stage begins when your cervix is fully dilated and you start pushing. This stage ends when your baby is born. °· The third stage is the delivery of the organ that nourished your baby during pregnancy (placenta). °First stage of labor °As your due date gets closer, you may start to notice certain physical changes that mean labor is going to start soon. You may feel that your baby has dropped lower into your pelvis. You may experience irregular, often painless, contractions that go away when you walk around or lie down (Braxton Hicks contractions). This is also called false labor. °The first stage of labor begins when you start having contractions that come at regular (evenly spaced) intervals and your cervix starts to get thinner and wider in preparation for your baby to pass through. Birth care providers measure the dilation of your cervix in centimeters (cm). One centimeter is a little less than one-half of an inch. The first stage ends when your cervix is dilated to 10 cm. The first stage of labor is divided into three phases: °· Early phase. °· Active phase. °· Transitional phase. °The length of the first stage of labor varies. It may be longer if this is your first pregnancy. You may spend most of this stage at home trying to relax and stay comfortable. °How does this affect me? °During the first stage of labor, you will move through three phases. °What happens in the early phase? °· You will start to have  regular contractions that last 30-60 seconds. Contractions may come every 5-20 minutes. Keep track of your contractions and call your birth care provider. °· Your water may break during this phase. °· You may notice a clear or slightly bloody discharge of mucus (mucus plug) from your vagina. °· Your cervix will dilate to 3-6 cm. °What happens in the active phase? °The active phase usually lasts 3-5 hours. You may go to the hospital or birth center around this time. During the active phase: °· Your contractions will become stronger, longer, and more uncomfortable. °· Your contractions may last 45-90 seconds and come every 3-5 minutes. °· You may feel lower back pain. °· Your birth care providers may examine your cervix and feel your belly to find the position of your baby. °· You may have a monitor strapped to your belly to measure your contractions and your baby's heart rate. °· You may start using your pain management options. °· Your cervix may be dilated to 6 cm and may start to dilate more quickly. °What happens in the transitional phase? °The transitional phase typically lasts from 30 minutes to 2 hours. At the end of this phase, your cervix will be fully dilated to 10 cm. During the transitional phase: °· Contractions will get stronger and longer. °· Contractions may last 60-90 seconds and come less than 2 minutes apart. °· You may feel hot flashes, chills, or nausea. °How does this affect my baby? °During the first stage of labor, your baby will   gradually move down into your birth canal. °Follow these instructions at home and in the hospital or birth center: ° °· When labor first begins, try to stay calm. You are still in the early phase. If it is night, try to get some sleep. If it is day, try to relax and save your energy. You may want to make some calls and get ready to go to the hospital or birth center. °· When you are in the early phase, try these methods to help ease discomfort: °? Deep breathing and  muscle relaxation. °? Taking a walk. °? Taking a warm bath or shower. °· Drink some fluids and have a light snack if you feel like it. °· Keep track of your contractions. °· Based on the plan you created with your birth care provider, call when your contractions indicate it is time. °· If your water breaks, note the time, color, and odor of the fluid. °· When you are in the active phase, do your breathing exercises and rely on your support people and your team of birth care providers. °Contact a health care provider if: °· Your contractions are strong and regular. °· You have lower back pain or cramping. °· Your water breaks. °· You lose your mucus plug. °Get help right away if you: °· Have a severe headache that does not go away. °· Have changes in your vision. °· Have severe pain in your upper belly. °· Do not feel the baby move. °· Have bright red bleeding. °Summary °· The first stage of labor starts when true labor begins, and it ends when your cervix is dilated to 10 cm. °· The first stage of labor has three phases: early, active, and transitional. °· Your baby moves into the birth canal during the first stage of labor. °· You may have contractions that become stronger and longer. You may also lose your mucus plug and have your water break. °· Call your birth care provider when your contractions are frequent and strong enough to go to the hospital or birth center. °This information is not intended to replace advice given to you by your health care provider. Make sure you discuss any questions you have with your health care provider. °Document Revised: 06/05/2018 Document Reviewed: 04/28/2017 °Elsevier Patient Education © 2020 [employer] ° °

## 2019-06-15 NOTE — MAU Note (Signed)
Pt reports to MAU with CTX 1-2 mins apart, she was seen here earlier but now they feel closer together is what she stated. +FM denies VB and LOF

## 2019-06-16 ENCOUNTER — Encounter (HOSPITAL_COMMUNITY): Payer: Self-pay | Admitting: Obstetrics and Gynecology

## 2019-06-16 ENCOUNTER — Inpatient Hospital Stay (HOSPITAL_COMMUNITY): Payer: Medicaid Other | Admitting: Anesthesiology

## 2019-06-16 ENCOUNTER — Encounter (HOSPITAL_COMMUNITY)
Admission: AD | Disposition: A | Discharge status: Home / Self Care | Payer: Self-pay | Source: Home / Self Care | Attending: Family Medicine

## 2019-06-16 DIAGNOSIS — Z3A39 39 weeks gestation of pregnancy: Secondary | ICD-10-CM | POA: Diagnosis not present

## 2019-06-16 DIAGNOSIS — Z30430 Encounter for insertion of intrauterine contraceptive device: Secondary | ICD-10-CM | POA: Diagnosis not present

## 2019-06-16 DIAGNOSIS — O3663X0 Maternal care for excessive fetal growth, third trimester, not applicable or unspecified: Secondary | ICD-10-CM | POA: Diagnosis present

## 2019-06-16 DIAGNOSIS — Z20822 Contact with and (suspected) exposure to covid-19: Secondary | ICD-10-CM | POA: Diagnosis present

## 2019-06-16 DIAGNOSIS — O26893 Other specified pregnancy related conditions, third trimester: Secondary | ICD-10-CM | POA: Diagnosis present

## 2019-06-16 DIAGNOSIS — E0500 Thyrotoxicosis with diffuse goiter without thyrotoxic crisis or storm: Secondary | ICD-10-CM | POA: Diagnosis present

## 2019-06-16 DIAGNOSIS — O99284 Endocrine, nutritional and metabolic diseases complicating childbirth: Secondary | ICD-10-CM | POA: Diagnosis present

## 2019-06-16 LAB — CBC
HCT: 38.9 % (ref 36.0–46.0)
Hemoglobin: 12.3 g/dL (ref 12.0–15.0)
MCH: 29 pg (ref 26.0–34.0)
MCHC: 31.6 g/dL (ref 30.0–36.0)
MCV: 91.7 fL (ref 80.0–100.0)
Platelets: 257 10*3/uL (ref 150–400)
RBC: 4.24 MIL/uL (ref 3.87–5.11)
RDW: 14.9 % (ref 11.5–15.5)
WBC: 11.5 10*3/uL — ABNORMAL HIGH (ref 4.0–10.5)
nRBC: 0 % (ref 0.0–0.2)

## 2019-06-16 LAB — TYPE AND SCREEN
ABO/RH(D): O POS
Antibody Screen: NEGATIVE

## 2019-06-16 LAB — RESPIRATORY PANEL BY RT PCR (FLU A&B, COVID)
Influenza A by PCR: NEGATIVE
Influenza B by PCR: NEGATIVE
SARS Coronavirus 2 by RT PCR: NEGATIVE

## 2019-06-16 LAB — RPR: RPR Ser Ql: NONREACTIVE

## 2019-06-16 LAB — ABO/RH: ABO/RH(D): O POS

## 2019-06-16 SURGERY — Surgical Case
Anesthesia: Epidural

## 2019-06-16 MED ORDER — LIDOCAINE-EPINEPHRINE (PF) 2 %-1:200000 IJ SOLN
INTRAMUSCULAR | Status: DC | PRN
  Administered 2019-06-16: 10 mL via EPIDURAL

## 2019-06-16 MED ORDER — ONDANSETRON HCL 4 MG/2ML IJ SOLN
INTRAMUSCULAR | Status: AC
  Filled 2019-06-16: qty 2

## 2019-06-16 MED ORDER — FENTANYL CITRATE (PF) 100 MCG/2ML IJ SOLN: 50.0000 ug | INTRAMUSCULAR | Status: DC | PRN

## 2019-06-16 MED ORDER — DEXAMETHASONE SODIUM PHOSPHATE 4 MG/ML IJ SOLN
INTRAMUSCULAR | Status: AC
  Filled 2019-06-16: qty 1

## 2019-06-16 MED ORDER — SENNOSIDES-DOCUSATE SODIUM 8.6-50 MG PO TABS
2.0000 | ORAL_TABLET | ORAL | Status: DC
  Administered 2019-06-17 – 2019-06-18 (×3): 2 via ORAL
  Filled 2019-06-16 (×3): qty 2

## 2019-06-16 MED ORDER — KETOROLAC TROMETHAMINE 30 MG/ML IJ SOLN
30.0000 mg | Freq: Four times a day (QID) | INTRAMUSCULAR | Status: AC
  Administered 2019-06-16 – 2019-06-17 (×4): 30 mg via INTRAVENOUS
  Filled 2019-06-16 (×4): qty 1

## 2019-06-16 MED ORDER — DEXAMETHASONE SODIUM PHOSPHATE 4 MG/ML IJ SOLN
INTRAMUSCULAR | Status: DC | PRN
  Administered 2019-06-16: 4 mg via INTRAVENOUS

## 2019-06-16 MED ORDER — LIDOCAINE HCL (PF) 1 % IJ SOLN: 30.0000 mL | INTRAMUSCULAR | Status: DC | PRN

## 2019-06-16 MED ORDER — WITCH HAZEL-GLYCERIN EX PADS: 1.0000 "application " | MEDICATED_PAD | CUTANEOUS | Status: DC | PRN

## 2019-06-16 MED ORDER — COCONUT OIL OIL: 1.0000 "application " | TOPICAL_OIL | Status: DC | PRN

## 2019-06-16 MED ORDER — TETANUS-DIPHTH-ACELL PERTUSSIS 5-2.5-18.5 LF-MCG/0.5 IM SUSP
0.5000 mL | Freq: Once | INTRAMUSCULAR | Status: AC
  Administered 2019-06-18: 0.5 mL via INTRAMUSCULAR
  Filled 2019-06-16: qty 0.5

## 2019-06-16 MED ORDER — OXYTOCIN 40 UNITS IN NORMAL SALINE INFUSION - SIMPLE MED
INTRAVENOUS | Status: AC
  Filled 2019-06-16: qty 1000

## 2019-06-16 MED ORDER — EPHEDRINE 5 MG/ML INJ: 10.0000 mg | INTRAVENOUS | Status: DC | PRN

## 2019-06-16 MED ORDER — LIDOCAINE HCL (PF) 1 % IJ SOLN
INTRAMUSCULAR | Status: DC | PRN
  Administered 2019-06-16: 6 mL via EPIDURAL

## 2019-06-16 MED ORDER — OXYCODONE-ACETAMINOPHEN 5-325 MG PO TABS
1.0000 | ORAL_TABLET | ORAL | Status: DC | PRN
  Administered 2019-06-18: 1 via ORAL
  Administered 2019-06-19 (×2): 2 via ORAL
  Filled 2019-06-16 (×2): qty 2
  Filled 2019-06-16: qty 1

## 2019-06-16 MED ORDER — SIMETHICONE 80 MG PO CHEW: 80.0000 mg | CHEWABLE_TABLET | ORAL | Status: DC | PRN

## 2019-06-16 MED ORDER — PHENYLEPHRINE 40 MCG/ML (10ML) SYRINGE FOR IV PUSH (FOR BLOOD PRESSURE SUPPORT)
80.0000 ug | PREFILLED_SYRINGE | INTRAVENOUS | Status: DC | PRN
  Filled 2019-06-16: qty 10

## 2019-06-16 MED ORDER — ZOLPIDEM TARTRATE 5 MG PO TABS: 5.0000 mg | ORAL_TABLET | Freq: Every evening | ORAL | Status: DC | PRN

## 2019-06-16 MED ORDER — DIPHENHYDRAMINE HCL 25 MG PO CAPS: 25.0000 mg | ORAL_CAPSULE | Freq: Four times a day (QID) | ORAL | Status: DC | PRN

## 2019-06-16 MED ORDER — LACTATED RINGERS IV SOLN: INTRAVENOUS | Status: DC

## 2019-06-16 MED ORDER — HYDROXYZINE HCL 50 MG PO TABS: 50.0000 mg | ORAL_TABLET | Freq: Four times a day (QID) | ORAL | Status: DC | PRN

## 2019-06-16 MED ORDER — OXYCODONE-ACETAMINOPHEN 5-325 MG PO TABS: 2.0000 | ORAL_TABLET | ORAL | Status: DC | PRN

## 2019-06-16 MED ORDER — PRENATAL MULTIVITAMIN CH
1.0000 | ORAL_TABLET | Freq: Every day | ORAL | Status: DC
  Administered 2019-06-17 – 2019-06-19 (×3): 1 via ORAL
  Filled 2019-06-16 (×3): qty 1

## 2019-06-16 MED ORDER — DIPHENHYDRAMINE HCL 50 MG/ML IJ SOLN: 12.5000 mg | INTRAMUSCULAR | Status: DC | PRN

## 2019-06-16 MED ORDER — METOCLOPRAMIDE HCL 5 MG/ML IJ SOLN
INTRAMUSCULAR | Status: AC
  Filled 2019-06-16: qty 2

## 2019-06-16 MED ORDER — SOD CITRATE-CITRIC ACID 500-334 MG/5ML PO SOLN
30.0000 mL | ORAL | Status: DC | PRN
  Filled 2019-06-16: qty 30

## 2019-06-16 MED ORDER — MEPERIDINE HCL 25 MG/ML IJ SOLN
INTRAMUSCULAR | Status: AC
  Filled 2019-06-16: qty 1

## 2019-06-16 MED ORDER — ONDANSETRON HCL 4 MG/2ML IJ SOLN
INTRAMUSCULAR | Status: DC | PRN
  Administered 2019-06-16: 4 mg via INTRAVENOUS

## 2019-06-16 MED ORDER — SODIUM CHLORIDE 0.9 % IV SOLN
INTRAVENOUS | Status: DC | PRN
  Administered 2019-06-16: 15:00:00 500 mg via INTRAVENOUS

## 2019-06-16 MED ORDER — MENTHOL 3 MG MT LOZG: 1.0000 | LOZENGE | OROMUCOSAL | Status: DC | PRN

## 2019-06-16 MED ORDER — SIMETHICONE 80 MG PO CHEW
80.0000 mg | CHEWABLE_TABLET | ORAL | Status: DC
  Administered 2019-06-17 – 2019-06-18 (×3): 80 mg via ORAL
  Filled 2019-06-16 (×3): qty 1

## 2019-06-16 MED ORDER — DIBUCAINE (PERIANAL) 1 % EX OINT: 1.0000 "application " | TOPICAL_OINTMENT | CUTANEOUS | Status: DC | PRN

## 2019-06-16 MED ORDER — SIMETHICONE 80 MG PO CHEW
80.0000 mg | CHEWABLE_TABLET | Freq: Three times a day (TID) | ORAL | Status: DC
  Administered 2019-06-17 – 2019-06-19 (×6): 80 mg via ORAL
  Filled 2019-06-16 (×5): qty 1

## 2019-06-16 MED ORDER — LACTATED RINGERS IV SOLN: 500.0000 mL | Freq: Once | INTRAVENOUS | Status: DC

## 2019-06-16 MED ORDER — SCOPOLAMINE 1 MG/3DAYS TD PT72
MEDICATED_PATCH | TRANSDERMAL | Status: AC
  Filled 2019-06-16: qty 1

## 2019-06-16 MED ORDER — IBUPROFEN 800 MG PO TABS
800.0000 mg | ORAL_TABLET | Freq: Four times a day (QID) | ORAL | Status: DC
  Administered 2019-06-17 – 2019-06-19 (×8): 800 mg via ORAL
  Filled 2019-06-16 (×9): qty 1

## 2019-06-16 MED ORDER — SODIUM CHLORIDE 0.9 % IV SOLN
INTRAVENOUS | Status: AC
  Filled 2019-06-16: qty 500

## 2019-06-16 MED ORDER — FLEET ENEMA 7-19 GM/118ML RE ENEM: 1.0000 | ENEMA | RECTAL | Status: DC | PRN

## 2019-06-16 MED ORDER — SODIUM CHLORIDE (PF) 0.9 % IJ SOLN
INTRAMUSCULAR | Status: DC | PRN
  Administered 2019-06-16: 12 mL/h via EPIDURAL

## 2019-06-16 MED ORDER — LEVONORGESTREL 19.5 MCG/DAY IU IUD
INTRAUTERINE_SYSTEM | INTRAUTERINE | Status: AC
  Filled 2019-06-16: qty 1

## 2019-06-16 MED ORDER — FENTANYL-BUPIVACAINE-NACL 0.5-0.125-0.9 MG/250ML-% EP SOLN
12.0000 mL/h | EPIDURAL | Status: DC | PRN
  Filled 2019-06-16: qty 250

## 2019-06-16 MED ORDER — LACTATED RINGERS IV SOLN
500.0000 mL | INTRAVENOUS | Status: DC | PRN
  Administered 2019-06-16: 02:00:00 1000 mL via INTRAVENOUS

## 2019-06-16 MED ORDER — ACETAMINOPHEN 325 MG PO TABS: 650.0000 mg | ORAL_TABLET | ORAL | Status: DC | PRN

## 2019-06-16 MED ORDER — MEPERIDINE HCL 25 MG/ML IJ SOLN
INTRAMUSCULAR | Status: DC | PRN
  Administered 2019-06-16 (×2): 12.5 mg via INTRAVENOUS

## 2019-06-16 MED ORDER — MORPHINE SULFATE (PF) 0.5 MG/ML IJ SOLN
INTRAMUSCULAR | Status: AC
  Filled 2019-06-16: qty 10

## 2019-06-16 MED ORDER — OXYTOCIN 40 UNITS IN NORMAL SALINE INFUSION - SIMPLE MED
2.5000 [IU]/h | INTRAVENOUS | Status: DC
  Filled 2019-06-16 (×2): qty 1000

## 2019-06-16 MED ORDER — ONDANSETRON HCL 4 MG/2ML IJ SOLN: 4.0000 mg | Freq: Four times a day (QID) | INTRAMUSCULAR | Status: DC | PRN

## 2019-06-16 MED ORDER — OXYTOCIN 40 UNITS IN NORMAL SALINE INFUSION - SIMPLE MED: 2.5000 [IU]/h | INTRAVENOUS | Status: AC

## 2019-06-16 MED ORDER — OXYTOCIN BOLUS FROM INFUSION: 500.0000 mL | Freq: Once | INTRAVENOUS | Status: DC

## 2019-06-16 MED ORDER — SODIUM CHLORIDE 0.9 % IV SOLN: INTRAVENOUS | Status: DC | PRN

## 2019-06-16 MED ORDER — OXYTOCIN 10 UNIT/ML IJ SOLN
INTRAMUSCULAR | Status: DC | PRN
  Administered 2019-06-16: 40 [IU] via INTRAMUSCULAR

## 2019-06-16 MED ORDER — METOCLOPRAMIDE HCL 5 MG/ML IJ SOLN
INTRAMUSCULAR | Status: DC | PRN
  Administered 2019-06-16: 10 mg via INTRAVENOUS

## 2019-06-16 MED ORDER — OXYCODONE-ACETAMINOPHEN 5-325 MG PO TABS: 1.0000 | ORAL_TABLET | ORAL | Status: DC | PRN

## 2019-06-16 MED ORDER — MORPHINE SULFATE (PF) 0.5 MG/ML IJ SOLN
INTRAMUSCULAR | Status: DC | PRN
  Administered 2019-06-16: 3 mg via EPIDURAL

## 2019-06-16 MED ORDER — FENTANYL-BUPIVACAINE-NACL 0.5-0.125-0.9 MG/250ML-% EP SOLN: 12.0000 mL/h | EPIDURAL | Status: DC | PRN

## 2019-06-16 MED ORDER — CEFAZOLIN SODIUM-DEXTROSE 2-3 GM-%(50ML) IV SOLR
INTRAVENOUS | Status: DC | PRN
  Administered 2019-06-16: 2 g via INTRAVENOUS

## 2019-06-16 MED ORDER — PHENYLEPHRINE 40 MCG/ML (10ML) SYRINGE FOR IV PUSH (FOR BLOOD PRESSURE SUPPORT): 80.0000 ug | PREFILLED_SYRINGE | INTRAVENOUS | Status: DC | PRN

## 2019-06-16 MED ORDER — LIDOCAINE-EPINEPHRINE (PF) 2 %-1:200000 IJ SOLN
INTRAMUSCULAR | Status: AC
  Filled 2019-06-16: qty 10

## 2019-06-16 SURGICAL SUPPLY — 37 items
APL SKNCLS STERI-STRIP NONHPOA (GAUZE/BANDAGES/DRESSINGS) ×1
BENZOIN TINCTURE PRP APPL 2/3 (GAUZE/BANDAGES/DRESSINGS) ×2 IMPLANT
CATH FOLEY LATEX FREE 14FR (CATHETERS) ×2
CATH FOLEY LF 14FR (CATHETERS) IMPLANT
CHLORAPREP W/TINT 26ML (MISCELLANEOUS) ×2 IMPLANT
CLAMP CORD UMBIL (MISCELLANEOUS) IMPLANT
CLOTH BEACON ORANGE TIMEOUT ST (SAFETY) ×2 IMPLANT
DRSG OPSITE POSTOP 4X10 (GAUZE/BANDAGES/DRESSINGS) ×2 IMPLANT
ELECT REM PT RETURN 9FT ADLT (ELECTROSURGICAL) ×2
ELECTRODE REM PT RTRN 9FT ADLT (ELECTROSURGICAL) ×1 IMPLANT
EXTRACTOR VACUUM M CUP 4 TUBE (SUCTIONS) IMPLANT
GAUZE SPONGE 4X4 12PLY STRL LF (GAUZE/BANDAGES/DRESSINGS) ×1 IMPLANT
GLOVE BIOGEL PI IND STRL 7.0 (GLOVE) ×2 IMPLANT
GLOVE BIOGEL PI IND STRL 7.5 (GLOVE) ×2 IMPLANT
GLOVE BIOGEL PI INDICATOR 7.0 (GLOVE) ×2
GLOVE BIOGEL PI INDICATOR 7.5 (GLOVE) ×2
GLOVE ECLIPSE 7.5 STRL STRAW (GLOVE) ×2 IMPLANT
GOWN STRL REUS W/TWL LRG LVL3 (GOWN DISPOSABLE) ×6 IMPLANT
HIBICLENS CHG 4% 4OZ BTL (MISCELLANEOUS) ×1 IMPLANT
KIT ABG SYR 3ML LUER SLIP (SYRINGE) IMPLANT
NDL HYPO 25X5/8 SAFETYGLIDE (NEEDLE) IMPLANT
NEEDLE HYPO 25X5/8 SAFETYGLIDE (NEEDLE) IMPLANT
NS IRRIG 1000ML POUR BTL (IV SOLUTION) ×2 IMPLANT
PACK C SECTION WH (CUSTOM PROCEDURE TRAY) ×2 IMPLANT
PAD ABD 7.5X8 STRL (GAUZE/BANDAGES/DRESSINGS) ×1 IMPLANT
PAD OB MATERNITY 4.3X12.25 (PERSONAL CARE ITEMS) ×2 IMPLANT
PENCIL SMOKE EVAC W/HOLSTER (ELECTROSURGICAL) ×2 IMPLANT
RTRCTR C-SECT PINK 25CM LRG (MISCELLANEOUS) ×2 IMPLANT
STRIP CLOSURE SKIN 1/2X4 (GAUZE/BANDAGES/DRESSINGS) ×2 IMPLANT
SUT VIC AB 0 CTX 36 (SUTURE) ×6
SUT VIC AB 0 CTX36XBRD ANBCTRL (SUTURE) ×3 IMPLANT
SUT VIC AB 2-0 CT1 27 (SUTURE) ×2
SUT VIC AB 2-0 CT1 TAPERPNT 27 (SUTURE) ×1 IMPLANT
SUT VIC AB 4-0 KS 27 (SUTURE) ×2 IMPLANT
TOWEL OR 17X24 6PK STRL BLUE (TOWEL DISPOSABLE) ×2 IMPLANT
TRAY FOLEY W/BAG SLVR 14FR LF (SET/KITS/TRAYS/PACK) ×2 IMPLANT
WATER STERILE IRR 1000ML POUR (IV SOLUTION) ×2 IMPLANT

## 2019-06-16 NOTE — Progress Notes (Signed)
FHT's assessed via doppler; 114-130's, acceleration heard

## 2019-06-16 NOTE — Progress Notes (Signed)
FHT's assessed via doppler; 120's-160's, acceleration heard.

## 2019-06-16 NOTE — Progress Notes (Signed)
Patient ID: Katie Bowers, female   DOB: [birth year], 29 y.o.   MRN: [id number]  *Late entry due to patient care  In to introduce self to pt; she is in the tub resting between ctx and breathing well with them  BP 112/64, P 110 FHR dopplered 120s with ctx Ctx q 2-4 mins Cx just recently checked per RN/ 9+/C/vtx +2  IUP@39 .6wks Active labor/transition GBS neg LGA per u/s  Continue with expectant management Plan for MD to be on unit during delivery due to expected LGA  Arabella Merles [facility] 06/16/2019

## 2019-06-16 NOTE — Discharge Summary (Addendum)
Postpartum Discharge Summary    Patient Name: Katie Bowers DOB: 1990-04-24 MRN: [id number]  Date of admission: 06/15/2019 Delivering Provider: Truett Mainland   Date of discharge: 06/18/2019  Admitting diagnosis: Indication for care in labor or delivery [O75.9] Intrauterine pregnancy: 104w6d    Secondary diagnosis:  Active Problems:   Graves' disease   Supervision of other normal pregnancy, antepartum   Hyperthyroidism   Indication for care in labor or delivery   IUD (intrauterine device) in place  Additional problems: none     Discharge diagnosis: Term Pregnancy Delivered                                                                                                Post partum procedures:none  Augmentation: AROM  Complications: None  Hospital course:  Onset of Labor With Unplanned C/S  29y.o. yo G2P1011 at 372w6das admitted in [facility] 06/15/2019. Patient had a labor course significant for fetal bradycardia and failed vacuum during second stage of labor. Membrane Rupture Time/Date: 12:50 AM ,06/16/2019   The patient went for cesarean section due to fetal bradycardia and failed vacuum, and delivered a Viable infant,06/16/2019  Details of operation can be found in separate operative note. Had post placental Liletta IUD placed at time of cesarean. Patient had an uncomplicated postpartum course.  She is ambulating,tolerating a regular diet, passing flatus, and urinating well.  Patient is discharged home in stable condition 06/18/19. Delivery time: 2:52 PM    Magnesium Sulfate received: No BMZ received: No Rhophylac:N/A MMR:N/A Transfusion:No  Physical exam  Vitals:   06/17/19 0804 06/17/19 1426 06/17/19 2102 06/18/19 0540  BP: 106/76 103/72 124/85 122/87  Pulse: 89 76 93 79  Resp: '20 19 15 16  ' Temp: 98.6 F (37 C) 98.1 F (36.7 C) 98.9 F (37.2 C) 98.1 F (36.7 C)  TempSrc: Oral Oral Oral Oral  SpO2: 99%  98%    General: alert, cooperative and no  distress Lochia: appropriate Uterine Fundus: firm Incision: Healing well with no significant drainage, Dressing is clean, dry, and intact DVT Evaluation: No evidence of DVT seen on physical exam. No significant calf/ankle edema. Labs: Lab Results  Component Value Date   WBC 16.7 (H) 06/17/2019   HGB 9.9 (L) 06/17/2019   HCT 31.1 (L) 06/17/2019   MCV 93.7 06/17/2019   PLT 193 06/17/2019   CMP Latest Ref Rng & Units 12/21/2015  Glucose 65 - 99 mg/dL 88  BUN 6 - 20 mg/dL 9  Creatinine 0.44 - 1.00 mg/dL 0.94  Sodium 135 - 145 mmol/L 141  Potassium 3.5 - 5.1 mmol/L 3.6  Chloride 101 - 111 mmol/L 111  CO2 22 - 32 mmol/L 24  Calcium 8.9 - 10.3 mg/dL 9.5  Total Protein 6.5 - 8.1 g/dL 6.6  Total Bilirubin 0.3 - 1.2 mg/dL 0.8  Alkaline Phos 38 - 126 U/L 62  AST 15 - 41 U/L 29  ALT 14 - 54 U/L 16   Edinburgh Score: Edinburgh Postnatal Depression Scale Screening Tool 06/16/2019  I have been able to laugh and see the funny side of  things. 0  I have looked forward with enjoyment to things. 0  I have blamed myself unnecessarily when things went wrong. 0  I have been anxious or worried for no good reason. 0  I have felt scared or panicky for no good reason. 0  Things have been getting on top of me. 0  I have been so unhappy that I have had difficulty sleeping. 0  I have felt sad or miserable. 0  I have been so unhappy that I have been crying. 0  The thought of harming myself has occurred to me. 0  Edinburgh Postnatal Depression Scale Total 0    Discharge instruction: per After Visit Summary and "Baby and Me Booklet".  After visit meds:  Allergies as of 06/18/2019      Reactions   Crab [shellfish Allergy]    Pt states allergy is just to crab and trout   Methimazole [methimazole] Swelling   Face and tongue.       Medication List    STOP taking these medications   valACYclovir 500 MG tablet Commonly known as: VALTREX     TAKE these medications   acetaminophen 325 MG  tablet Commonly known as: TYLENOL Take 2 tablets (650 mg total) by mouth every 4 (four) hours as needed.   Sebastian Supp Sm Misc Wear as directed.   docusate sodium 100 MG capsule Commonly known as: COLACE Take 1 capsule (100 mg total) by mouth 2 (two) times daily as needed.   ferrous sulfate 325 (65 FE) MG tablet Take 1 tablet (325 mg total) by mouth daily with breakfast.   ibuprofen 600 MG tablet Commonly known as: ADVIL Take 1 tablet (600 mg total) by mouth every 6 (six) hours as needed.   oxyCODONE 5 MG immediate release tablet Commonly known as: Roxicodone Take 1 tablet (5 mg total) by mouth every 8 (eight) hours as needed.   pantoprazole 40 MG tablet Commonly known as: Protonix Take 1 tablet (40 mg total) by mouth daily.   polyethylene glycol powder 17 GM/SCOOP powder Commonly known as: GLYCOLAX/MIRALAX Take 17 g by mouth daily as needed.   prenatal multivitamin Tabs tablet Take 1 tablet by mouth daily at 12 noon.   promethazine 25 MG tablet Commonly known as: PHENERGAN Take 1 tablet (25 mg total) by mouth every 6 (six) hours as needed for nausea or vomiting.       Diet: routine diet  Activity: Advance as tolerated. Pelvic rest for 6 weeks.   Outpatient follow up:2 weeks Follow up Appt: Future Appointments  Date Time Provider [place]  06/30/2019  1:10 PM [place] None  07/15/2019  1:00 PM Sloan Leiter, MD [facility] None   Follow up Visit:    Please schedule this patient for Postpartum visit in: 4 weeks with the following provider: Any provider In-Person For C/S patients schedule nurse incision check in weeks 2 weeks: yes Low risk pregnancy complicated by: none Delivery mode:  CS Anticipated Birth Control:  PP IUD Placed PP Procedures needed: Incision check , string check, TSH/T3/T4 check Schedule Integrated BH visit: no     Newborn Data: Live born female  Birth Weight:  3790 grams APGAR: 84, 9  Newborn  Delivery   Birth date/time: 06/16/2019 14:52:00 Delivery type:       Baby Feeding: Breast Disposition:home with mother   06/18/2019 Clarnce Flock, MD

## 2019-06-16 NOTE — H&P (Signed)
Katie Bowers is a 29 y.o. female G2P0010 with IUP at [redacted]w[redacted]d presenting for contractions. Pt states she has been having regular, every 2 minutes contractions, associated with none vaginal bleeding for several hours. Was in MAU 12 hours ago, 1.5cms then.  Membranes are intact, with active fetal movement.   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 ASCUS (atypical squamous cells of undetermined significance) on Pap smear 12/2009   with high grade HPV  . Chlamydia 06/2009, 12/2009  . Grave's disease 06/2010   sees Dr. Margaretmary Bayley  . Hyperthyroidism   . 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 NO PAST SURGERIES      Obstetrical History: OB History    Gravida  2   Para      Term      Preterm      AB  1   Living  0     SAB      TAB  1   Ectopic      Multiple      Live Births               Social History: Social History   Socioeconomic History  . Marital status: Single    Spouse name: Not on file  . Number of children: 0  . Years of education: Not on file  . Highest education level: Not on file  Occupational History  . Not on file  Tobacco Use  . Smoking status: Never Smoker  . Smokeless tobacco: Never Used  Substance and Sexual Activity  . Alcohol use: Not Currently    Alcohol/week: 0.0 standard drinks    Comment: 2 drinks every other month.  . Drug use: Not Currently    Types: Marijuana    Comment: last smoked 2013  . Sexual activity: Yes    Partners: Male  Other Topics Concern  . Not on file  Social History Narrative   Lives with her mother, no pets.  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 Difficulty of Paying Living Expenses:   Food Insecurity:   . Worried  About Programme researcher, broadcasting/film/video in the Last Year:   . Barista in the Last Year:   Transportation Needs:   . Freight forwarder (Medical):   Marland Kitchen Lack of Transportation (Non-Medical):   Physical Activity:   . Days of Exercise per Week:   . Minutes of Exercise per Session:   Stress:   . Feeling of Stress :   Social Connections:   . Frequency of Communication with Friends and Family:   . Frequency of Social Gatherings with Friends and Family:   . Attends Religious Services:   . Active Member of Clubs or Organizations:   . Attends Banker Meetings:   Marland Kitchen Marital Status:     Family History: Family History  Problem Relation Age of Onset  . Hyperlipidemia Mother   . Hypothyroidism Mother   . Cancer Maternal Uncle        lung cancer (smoker)  . Hyperthyroidism Paternal Aunt   . Hypertension Maternal Grandmother   . Hypothyroidism Paternal Grandmother     Allergies: Allergies  Allergen Reactions  . Crab [Shellfish Allergy]  Pt states allergy is just to crab and trout   . Methimazole [Methimazole] Swelling    Face and tongue.     Medications Prior to Admission  Medication Sig Dispense Refill Last Dose  . docusate sodium (COLACE) 100 MG capsule Take 1 capsule (100 mg total) by mouth 2 (two) times daily as needed. 30 capsule 2   . Elastic Bandages & Supports (COMFORT FIT MATERNITY SUPP SM) MISC Wear as directed. 1 each 0   . ferrous sulfate 325 (65 FE) MG tablet Take 1 tablet (325 mg total) by mouth daily with breakfast. 30 tablet 3   . pantoprazole (PROTONIX) 40 MG tablet Take 1 tablet (40 mg total) by mouth daily. 30 tablet 2   . Prenatal Vit-Fe Fumarate-FA (PRENATAL MULTIVITAMIN) TABS tablet Take 1 tablet by mouth daily at 12 noon.     . promethazine (PHENERGAN) 25 MG tablet Take 1 tablet (25 mg total) by mouth every 6 (six) hours as needed for nausea or vomiting. 30 tablet 2   . valACYclovir (VALTREX) 500 MG tablet Take 1 tablet (500 mg total) by mouth 2  (two) times daily. 60 tablet 1         Review of Systems   Constitutional: Negative for fever and chills Eyes: Negative for visual disturbances Respiratory: Negative for shortness of breath, dyspnea Cardiovascular: Negative for chest pain or palpitations  Gastrointestinal: Negative for vomiting, diarrhea and constipation.  POSITIVE for abdominal pain (contractions) Genitourinary: Negative for dysuria and urgency Musculoskeletal: Negative for back pain, joint pain, myalgias  Neurological: Negative for dizziness and headaches      Blood pressure 118/85, pulse (!) 103, last menstrual period 09/10/2018, SpO2 100 %. General appearance: alert, cooperative and mild distress Lungs: normal respiratory effort Heart: regular rate and rhythm Abdomen: soft, non-tender; bowel sounds normal Extremities: Homans sign is negative, no sign of DVT DTR's 2+ Presentation: cephalic Fetal monitoring  Baseline: 140 bpm, Variability: Good {> 6 bpm), Accelerations: Reactive and Decelerations: Absent Uterine activity  2 minues Dilation: 4.5 Effacement (%): 100 Station: -2 Exam by:: Irish Elders, RN   Prenatal labs: ABO, Rh: O/Positive/-- (09/30 1047) Antibody: Negative (09/30 1047) Rubella: 4.65 (09/30 1047) RPR: Non Reactive (02/04 0913)  HBsAg: Negative (09/30 1047)  HIV: Non Reactive (02/04 0913)  GBS: Negative/-- (03/23 0424)   Nursing Staff Provider  Office Location  [facility] Dating  LMP  Language  English Anatomy US  02-06-19  Flu Vaccine  Declined 11-26-18 Genetic Screen  NIPS: low risks  AFP:   First Screen:  Quad:    TDaP vaccine   Given 04/02/19 Hgb A1C or  GTT Early  Third trimester   Rhogam     LAB RESULTS   Feeding Plan Breast Blood Type O/Positive/-- (09/30 1047)   Contraception Depo Antibody Negative (09/30 1047)  Circumcision yes Rubella 4.65 (09/30 1047)  Pediatrician   unsure RPR Non Reactive (02/04 0913)   Support Person FOB HBsAg Negative (09/30 1047)   Prenatal  Classes  HIV Non Reactive (02/04 0913)  BTL Consent  GBS Negative/-- (03/23 0424)(For PCN allergy, check sensitivities)   VBAC Consent  Pap 11/2018: wnl    Hgb Electro  normal  BP Cuff  yes CF negative    SMA 3 copies    Waterbirth  [x]  Class [ ]  Consent [x]  CNM visit     Prenatal Transfer Tool  Maternal Diabetes: No Genetic Screening: Normal Maternal Ultrasounds/Referrals: Normal Fetal Ultrasounds or other Referrals:  None Maternal Substance Abuse:  No  Significant Maternal Medications:  None Significant Maternal Lab Results: Group B Strep negative     Results for orders placed or performed during the hospital encounter of 06/15/19 (from the past 24 hour(s))  POCT fern test   Collection Time: 06/15/19 11:22 AM  Result Value Ref Range   POCT Fern Test Negative = intact amniotic membranes     Assessment: Katie Bowers is a 29 y.o. G2P0010 with an IUP at [redacted]w[redacted]d presenting for labor  Plan: #Labor: expectant management #Pain:  Per request #FWB Cat 1  Christin Fudge 06/16/2019, 12:49 AM

## 2019-06-16 NOTE — Progress Notes (Signed)
FHT's assessed via doppler; 120's-30's, acceleration heard.

## 2019-06-16 NOTE — Lactation Note (Signed)
This note was copied from a baby's chart. Lactation Consultation Note  Patient Name: Katie Bowers [id number] Date: 06/16/2019  P1, 7 hour term female infant. LC attempted to visit mom, RN alerted LC that infant is in [address] due to [employer]. LC will make another attempt to see family when infant returns to room with parents.    Maternal Data    Feeding Feeding Type: Breast Fed  LATCH Score Latch: Repeated attempts needed to sustain latch, nipple held in mouth throughout feeding, stimulation needed to elicit sucking reflex.  Audible Swallowing: A few with stimulation  Type of Nipple: Inverted  Comfort (Breast/Nipple): Soft / non-tender  Hold (Positioning): Assistance needed to correctly position infant at breast and maintain latch.  LATCH Score: 5  Interventions    Lactation Tools Discussed/Used Tools: Nipple Shields Nipple shield size: 20   Consult Status      Danelle Earthly 06/16/2019, 10:14 PM

## 2019-06-16 NOTE — Anesthesia Postprocedure Evaluation (Signed)
Anesthesia Post Note  Patient: Katie Bowers  Procedure(s) Performed: CESAREAN SECTION (N/A )     Patient location during evaluation: Mother Baby Anesthesia Type: Epidural Level of consciousness: awake and alert Pain management: pain level controlled Vital Signs Assessment: post-procedure vital signs reviewed and stable Respiratory status: spontaneous breathing, nonlabored ventilation and respiratory function stable Cardiovascular status: stable Postop Assessment: no headache, no backache and epidural receding Anesthetic complications: no    Last Vitals:  Vitals:   06/16/19 1547 06/16/19 1553  BP:  111/83  Pulse: (!) 124   Resp: (!) 21   Temp:    SpO2: 100%     Last Pain:  Vitals:   06/16/19 1539  TempSrc: Oral  PainSc: 0-No pain   Pain Goal:                   Earlee Herald

## 2019-06-16 NOTE — Lactation Note (Signed)
This note was copied from a baby's chart. Baby taken back to Mom's room after being in Nursery under heat shield for prolonged low temps.  Attempted to latch baby without nipple shield unsuccessfully.  Baby did latch to Mom's breast with the #20 nipple shield but did not suck.  Tried to latch baby to breast for about 10 mins unsuccessfully. Latch score given of 3.  Started Mom on DEBP & baby did have a medium sized spit up.  Instructed Mom to pump with each feeding if baby is not interested in latching.  Due to poor feeding & decreased temp a CBG was ordered and found to be 77.  Mom reassured.

## 2019-06-16 NOTE — Progress Notes (Signed)
FHT's assessed via doppler; 120's-130's; acceleration heard

## 2019-06-16 NOTE — Anesthesia Preprocedure Evaluation (Signed)
Anesthesia Evaluation  Patient identified by MRN, date of birth, ID band Patient awake    Reviewed: Allergy & Precautions, H&P , NPO status , Patient's Chart, lab work & pertinent test results, reviewed documented beta blocker date and time   Airway Mallampati: I  TM Distance: >3 FB Neck ROM: full    Dental no notable dental hx. (+) Teeth Intact, Dental Advisory Given   Pulmonary neg pulmonary ROS,    Pulmonary exam normal breath sounds clear to auscultation       Cardiovascular negative cardio ROS Normal cardiovascular exam Rhythm:regular Rate:Normal     Neuro/Psych negative neurological ROS  negative psych ROS   GI/Hepatic negative GI ROS, Neg liver ROS,   Endo/Other  Hyperthyroidism   Renal/GU negative Renal ROS  negative genitourinary   Musculoskeletal   Abdominal   Peds  Hematology negative hematology ROS (+)   Anesthesia Other Findings   Reproductive/Obstetrics (+) Pregnancy                             Anesthesia Physical Anesthesia Plan  ASA: III  Anesthesia Plan: Epidural   Post-op Pain Management:    Induction:   PONV Risk Score and Plan:   Airway Management Planned:   Additional Equipment:   Intra-op Plan:   Post-operative Plan:   Informed Consent: I have reviewed the patients History and Physical, chart, labs and discussed the procedure including the risks, benefits and alternatives for the proposed anesthesia with the patient or authorized representative who has indicated his/her understanding and acceptance.     Dental Advisory Given  Plan Discussed with:   Anesthesia Plan Comments: (Labs checked- platelets confirmed with RN in room. Fetal heart tracing, per RN, reported to be stable enough for sitting procedure. Discussed epidural, and patient consents to the procedure:  included risk of possible headache,backache, failed block, allergic reaction, and  nerve injury. This patient was asked if she had any questions or concerns before the procedure started.)        Anesthesia Quick Evaluation

## 2019-06-16 NOTE — Progress Notes (Addendum)
FHT's assessed via doppler; 130's-140's, acceleration heard.

## 2019-06-16 NOTE — Progress Notes (Signed)
Patient ID: Katie Bowers, female   DOB: 08-Mar-1990, 29 y.o.   MRN: [id number]  Comfortable with epidural; on L side currently; not feeling pressure  FHR 115-120, +accels, +LTV, some variables Ctx q 3 mins Cx C/C/vtx +2  IUP@term  End 1st stage  Will start pushing despite not feeling pressure and then reeval; may need to adjust epidural if vtx isn't descending  Arabella Merles [facility] 06/16/2019

## 2019-06-16 NOTE — Progress Notes (Signed)
Patient Name: Katie Bowers, female   DOB: Feb 10, 1991, 29 y.o.  MRN: [id number]  Late documentation.   Called to patient room for variable decels with slow return to baseline. Assessed patient baby at +2 station. Use of vacuum discussed: risks of cephalohematoma, intercranial bleeding, severe laceration, shoulder dystocia. After patient consent, vacuum applied. Fetal station at +2 with good maternal effort. Mity Vacuum applied below anterior fontenelle. Pressure increased with contraction and pulled during contractions. Pressure released in between contractions. Pulled during 4 contractions without significant improvement of fetal station. Continued to have fetal bradycardia. Discussed cesarean section, patient amenable. Stat cesarean section called.  The risks of cesarean section discussed with the patient included but were not limited to: bleeding which may require transfusion or reoperation; infection which may require antibiotics; injury to bowel, bladder, ureters or other surrounding organs; injury to the fetus; need for additional procedures including hysterectomy in the event of a life-threatening hemorrhage; placental abnormalities wth subsequent pregnancies, incisional problems, thromboembolic phenomenon and other postoperative/anesthesia complications. The patient concurred with the proposed plan, giving informed written consent for the procedure.   Patient has been NPO since last night, she will remain NPO for procedure. Anesthesia and OR aware.  Preoperative prophylactic Ancef and azithromycin ordered on call to the OR.  Levie Heritage, DO 06/16/2019 3:40 PM

## 2019-06-16 NOTE — Progress Notes (Signed)
Patient Vitals for the past 4 hrs:  BP Temp Temp src Pulse  06/16/19 0622 115/73 98.1 F (36.7 C) Oral (!) 129   In tub, coping well with help of husband, nurse.  FHR dopplers in the 130s, no decels.  Reactive tracing prior to getting in tub.  Ctx q 2-3 minutes. Cx 8/90/0 per RN.  Continue expectant mgt.

## 2019-06-16 NOTE — Op Note (Signed)
Katie Bowers PROCEDURE DATE: 06/16/2019  PREOPERATIVE DIAGNOSIS: Intrauterine pregnancy at  [redacted]w[redacted]d weeks gestation; arrest of descent, failed vacuum, fetal bradycardia  POSTOPERATIVE DIAGNOSIS: The same  PROCEDURE: Stat Primary Low Transverse Cesarean Section with IUD insertion  SURGEON:  Dr. Loma Boston  ASSISTANT: Dr Early Chars  INDICATIONS: Katie Bowers is a 29 y.o. G2P1011 at [redacted]w[redacted]d scheduled for cesarean section secondary to failure to progress: arrest of descent, non-reassuring fetal status and failed vacuum.  The risks of cesarean section discussed with the patient included but were not limited to: bleeding which may require transfusion or reoperation; infection which may require antibiotics; injury to bowel, bladder, ureters or other surrounding organs; injury to the fetus; need for additional procedures including hysterectomy in the event of a life-threatening hemorrhage; placental abnormalities wth subsequent pregnancies, incisional problems, thromboembolic phenomenon and other postoperative/anesthesia complications. The patient concurred with the proposed plan, giving informed written consent for the procedure.    FINDINGS:  Viable female infant in vertex. ROP presentation.  Apgars 8 and 9, weight, 8 pounds and 5 ounces.  Clear amniotic fluid.  Intact placenta, three vessel cord.  Normal uterus, fallopian tubes and ovaries bilaterally.  ANESTHESIA:    Spinal INTRAVENOUS FLUIDS: 1300 ml ESTIMATED BLOOD LOSS: 276 ml URINE OUTPUT:  150 ml SPECIMENS: Placenta sent to L&D COMPLICATIONS: None immediate  PROCEDURE IN DETAIL:  The patient received intravenous antibiotics and had sequential compression devices applied to her lower extremities while in the preoperative area.  She was then taken to the operating room where epidural anesthesia was dosed up to surgical level and was found to be adequate. She was then placed in a dorsal supine position with a leftward tilt, and prepped and  draped in a sterile manner.  A foley catheter was already in her bladder and attached to constant gravity, which drained clear fluid throughout.  After an adequate timeout was performed, a Pfannenstiel skin incision was made with scalpel and carried through to the underlying layer of fascia. The fascia was incised in the midline and this incision was extended bilaterally bluntly. The superior aspect of the fascia was elevated and bluntly dissected away from the underlying rectus muscles. The rectus muscles were separated in the midline bluntly and the peritoneum was entered bluntly. A bladder blade was placed to retract the bladder.   Attention was turned to the lower uterine segment where a transverse hysterotomy was made with a scalpel and extended bilaterally bluntly. As the head was low in the pelvis, a nurse with a sterile glove helped to support the fetal head, while the head was elevated from the pelvis. The infant was successfully delivered and immediately cried. After a minute of delayed cord clamping, the cord was clamped and cut and infant was handed over to awaiting neonatology team. Uterine massage was then administered and the placenta delivered intact with three-vessel cord. The uterus was then cleared of clot and debris.    A Liletta IUD was removed from the insertion device and the strings trimmed to 10cm. The IUD was placed in the fundus and the strings pushed through the os with a ring forceps, which were then immediately passed off the field.   The hysterotomy was closed with 0 Vicryl in a running locked fashion, and an imbricating layer was also placed with a 0 Vicryl. Overall, excellent hemostasis was noted. The abdomen and the pelvis were cleared of all clot and debris. Hemostasis was confirmed on all surfaces.  The peritoneum was reapproximated using  2-0 vicryl running stitches. The fascia was then closed using 0 Vicryl in a running fashion. The skin was closed with 4-0 vicryl. The  patient tolerated the procedure well. Sponge, lap, instrument and needle counts were correct x 2. She was taken to the recovery room in stable condition.    Levie Heritage, DO 06/16/2019 3:32 PM

## 2019-06-16 NOTE — Anesthesia Procedure Notes (Signed)
Epidural Patient location during procedure: OB Start time: 06/16/2019 9:57 AM End time: 06/16/2019 9:59 AM  Staffing Anesthesiologist: Bethena Midget, MD  Preanesthetic Checklist Completed: patient identified, IV checked, site marked, risks and benefits discussed, surgical consent, monitors and equipment checked, pre-op evaluation and timeout performed  Epidural Patient position: sitting Prep: DuraPrep and site prepped and draped Patient monitoring: continuous pulse ox and blood pressure Approach: midline Location: L3-L4 Injection technique: LOR air  Needle:  Needle type: Tuohy  Needle gauge: 17 G Needle length: 9 cm and 9 Needle insertion depth: 4 cm Catheter type: closed end flexible Catheter size: 19 Gauge Catheter at skin depth: 9 cm Test dose: negative  Assessment Events: blood not aspirated, injection not painful, no injection resistance, no paresthesia and negative IV test

## 2019-06-16 NOTE — Transfer of Care (Signed)
Immediate Anesthesia Transfer of Care Note  Patient: Katie Bowers  Procedure(s) Performed: CESAREAN SECTION (N/A )  Patient Location: PACU  Anesthesia Type:Epidural  Level of Consciousness: awake, alert  and oriented  Airway & Oxygen Therapy: Patient Spontanous Breathing  Post-op Assessment: Report given to RN and Post -op Vital signs reviewed and stable  Post vital signs: Reviewed and stable  Last Vitals:  Vitals Value Taken Time  BP    Temp    Pulse    Resp    SpO2      Last Pain:  Vitals:   06/16/19 1401  TempSrc: Oral  PainSc:          Complications: No apparent anesthesia complications

## 2019-06-16 NOTE — Progress Notes (Addendum)
Patient ID: Katie Bowers, female   DOB: [birth year], 29 y.o.   MRN: [id number]  Resting on R side with peanut ball; starting to feel a bit more pressure; did some pushing with ctx for about 30 mins but was having FHR decels to 90s, good variability, but attending not readily available in case of emergency so she is resting for a bit currently; expressed desires for IUD to be placed postplacentally  BP 102/69, P 97 FHR 115, +accels, no decels since resting Ctx q 3 mins, spont Vtx +2  IUP@term  End 1st stage FHR variables during pushing  -Will resume pushing shortly; will have additional help nearby for potential SD -Reviewed R&Bs of having IUD in general, including failure rate of 02/998, expulsion, and possible perforation; specifically with having it placed postplacentally there is an approx 10% greater chance of expulsion but very small chance of perforation. Pt verbally agrees to having the IUD placed postplacentally.  Arabella Merles CNM 06/16/2019 1:44 PM

## 2019-06-16 NOTE — Progress Notes (Signed)
FHT's assessed via doppler; 120's-150's, acceleration heard. Patient feeling pressure, CNM notified.

## 2019-06-17 LAB — CBC
HCT: 31.1 % — ABNORMAL LOW (ref 36.0–46.0)
Hemoglobin: 9.9 g/dL — ABNORMAL LOW (ref 12.0–15.0)
MCH: 29.8 pg (ref 26.0–34.0)
MCHC: 31.8 g/dL (ref 30.0–36.0)
MCV: 93.7 fL (ref 80.0–100.0)
Platelets: 193 10*3/uL (ref 150–400)
RBC: 3.32 MIL/uL — ABNORMAL LOW (ref 3.87–5.11)
RDW: 15.3 % (ref 11.5–15.5)
WBC: 16.7 10*3/uL — ABNORMAL HIGH (ref 4.0–10.5)
nRBC: 0 % (ref 0.0–0.2)

## 2019-06-17 MED ORDER — ONDANSETRON 4 MG PO TBDP
4.0000 mg | ORAL_TABLET | Freq: Three times a day (TID) | ORAL | Status: DC | PRN
  Administered 2019-06-17: 4 mg via ORAL
  Filled 2019-06-17: qty 1

## 2019-06-17 NOTE — Progress Notes (Addendum)
Patient ID: Katie Bowers, female   DOB: 09-30-1990, 29 y.o.   MRN: [id number] Post Operative Day 1 Subjective: no complaints, up ad lib, tolerating PO and + flatus  She had mild dizziness upon standing last night, but this has since resolved.   Objective: Blood pressure 101/72, pulse 84, temperature (!) 97.5 F (36.4 C), temperature source Oral, resp. rate 18, last menstrual period 09/10/2018, SpO2 100 %, unknown if currently breastfeeding.  Physical Exam:  General: alert, cooperative and no distress Lochia: appropriate Uterine Fundus: firm Incision: not examined. Large bandage in place with no signs of bleeding.  DVT Evaluation: No evidence of DVT seen on physical exam.  Recent Labs    06/16/19 0114 06/17/19 0600  HGB 12.3 9.9*  HCT 38.9 31.1*    Assessment/Plan: Plan for discharge tomorrow and Circumcision prior to discharge  Repeat H&H prior to discharge.    LOS: 1 day   Dorothyann Gibbs 06/17/2019, 7:46 AM

## 2019-06-17 NOTE — Progress Notes (Signed)
Subjective: Postpartum Day 1: Cesarean Delivery Patient reports incisional pain and tolerating PO.    Objective: Vital signs in last 24 hours: Temp:  [97.5 F (36.4 C)-100 F (37.8 C)] 97.5 F (36.4 C) (04/21 0021) Pulse Rate:  [69-124] 84 (04/21 0422) Resp:  [16-21] 18 (04/21 0422) BP: (100-133)/(63-94) 101/72 (04/21 0422) SpO2:  [99 %-100 %] 100 % (04/21 0422)  Physical Exam:  General: alert, cooperative and no distress Lochia: appropriate Uterine Fundus: firm Incision: healing well, no significant drainage, no dehiscence DVT Evaluation: No evidence of DVT seen on physical exam. Negative Homan's sign.  Recent Labs    06/16/19 0114 06/17/19 0600  HGB 12.3 9.9*  HCT 38.9 31.1*    Assessment/Plan: Status post Cesarean section. Doing well postoperatively.  Continue current care.  Levie Heritage 06/17/2019, 7:54 AM

## 2019-06-17 NOTE — Lactation Note (Signed)
This note was copied from a baby's chart. Lactation Consultation Note  Patient Name: Katie Bowers [id number] Date: 06/17/2019  [facility] entered room, mom and infant asleep at this time.    Maternal Data    Feeding Feeding Type: Breast Fed  LATCH Score Latch: Too sleepy or reluctant, no latch achieved, no sucking elicited.  Audible Swallowing: None  Type of Nipple: Inverted  Comfort (Breast/Nipple): Soft / non-tender  Hold (Positioning): Assistance needed to correctly position infant at breast and maintain latch.  LATCH Score: 3  Interventions Interventions: Breast feeding basics reviewed;Assisted with latch;Skin to skin;Hand express;Position options;Support pillows  Lactation Tools Discussed/Used Tools: Nipple Dorris Carnes;Pump Nipple shield size: 20 Breast pump type: Double-Electric Breast Pump WIC Program: Yes Pump Review: Setup, frequency, and cleaning Initiated by:: Amy Crews Date initiated:: 06/16/19   Consult Status      Haidee Stogsdill 06/17/2019, 2:15 AM

## 2019-06-17 NOTE — Lactation Note (Addendum)
This note was copied from a baby's chart. Lactation Consultation Note  Patient Name: Katie Bowers [id number] Date: 06/17/2019 Reason for consult: Initial assessment;1st time breastfeeding;Term P1, 13 hour term female infant. Mom with hx: hyperthyroid and C/S delivery.  Infant had 2 stools and one void since birth. Per mom, she receives [facility] in [facility] [employer] and has a Careers information officer. Mom was given hand pump prn due to not having DEBP at home. Per mom, infant has not been latching at breast, she made a few attempts but infant was not sustaining latch, infant  was taken to [address] [facility] earlier due low temps and had 3 episodes of emesis. LC entered room, infant was in basinet and cuing to breast feed. Mom attempt latch infant at first without NS  but infant did not sustain latch, LC had mom pre-pump breast with harmony hand pump, fitted with 24 mm NS, infant latched with 24 mm NS  on left breast using the football hold. Infant was off and on breast at first, LC took infant off breast and did suck training, flanging infant's  top and bottom lip out , sliding gloved finger in and out of infant's mouth, infant started having a rthymitic pattern. Mom re-latch infant on left breast with football hold position and infant latch and breastfed for 30 minutes. Mom taught back hand expression and infant was given 3 mls of colostrum by spoon.  LC went over using hand pump again with mom and nipple shafted everted completely out and infant latched without NS for additional 5 minutes. Mom knows to breastfeed infant according to cues, on demand and not exceed 3 hours without breastfeeding infant. Mom knows to call RN or LC if she has any further questions, concerns or need assistance with latching infant at breast. Mom will continue working on infant latching at breast. Mom knows to wear breast shells in bra during the day and not at night and to pre-pump breast with hand pump prior to latching  infant at breast. Mom will attempt to latch infant without NS at first but understands if she needs to use it , that she will. Mom will use DEBP every 3 hours for 15 minutes on initial setting. Mom shown how to use DEBP & how to disassemble, clean, & reassemble parts Mom made aware of O/P services, breastfeeding support groups, community resources, and our phone # for post-discharge questions. .  Maternal Data Formula Feeding for Exclusion: No Has patient been taught Hand Expression?: Yes Does the patient have breastfeeding experience prior to this delivery?: No  Feeding Feeding Type: Breast Fed  LATCH Score Latch: Repeated attempts needed to sustain latch, nipple held in mouth throughout feeding, stimulation needed to elicit sucking reflex.  Audible Swallowing: A few with stimulation  Type of Nipple: Inverted  Comfort (Breast/Nipple): Soft / non-tender  Hold (Positioning): Assistance needed to correctly position infant at breast and maintain latch.  LATCH Score: 5  Interventions Interventions: Breast feeding basics reviewed;Breast compression;Assisted with latch;Adjust position;Skin to skin;Support pillows;DEBP;Hand pump;Breast massage;Position options;Hand express;Expressed milk;Pre-pump if needed;Shells  Lactation Tools Discussed/Used Tools: Shells;Pump Nipple shield size: 24 Shell Type: Inverted Breast pump type: Double-Electric Breast Pump;Manual WIC Program: Yes Pump Review: Setup, frequency, and cleaning;Milk Storage Initiated by:: by RN Date initiated:: 06/16/19   Consult Status Consult Status: Follow-up Date: 06/17/19 Follow-up type: In-patient    Danelle Earthly 06/17/2019, 4:18 AM

## 2019-06-18 DIAGNOSIS — Z975 Presence of (intrauterine) contraceptive device: Secondary | ICD-10-CM

## 2019-06-18 MED ORDER — POLYETHYLENE GLYCOL 3350 17 GM/SCOOP PO POWD: 17.0000 g | Freq: Every day | ORAL | 1 refills | Status: DC | PRN

## 2019-06-18 MED ORDER — FERROUS SULFATE 325 (65 FE) MG PO TABS
325.0000 mg | ORAL_TABLET | ORAL | Status: DC
  Administered 2019-06-18: 325 mg via ORAL
  Filled 2019-06-18: qty 1

## 2019-06-18 MED ORDER — OXYCODONE HCL 5 MG PO TABS: 5.0000 mg | ORAL_TABLET | Freq: Three times a day (TID) | ORAL | 0 refills | Status: AC | PRN

## 2019-06-18 MED ORDER — IBUPROFEN 600 MG PO TABS: 600.0000 mg | ORAL_TABLET | Freq: Four times a day (QID) | ORAL | 0 refills | Status: DC | PRN

## 2019-06-18 MED ORDER — ACETAMINOPHEN 325 MG PO TABS: 650.0000 mg | ORAL_TABLET | ORAL | 0 refills | Status: DC | PRN

## 2019-06-18 NOTE — Discharge Instructions (Signed)
Cesarean Delivery, Care After This sheet gives you information about how to care for yourself after your procedure. Your health care provider may also give you more specific instructions. If you have problems or questions, contact your health care provider. What can I expect after the procedure? After the procedure, it is common to have:  A small amount of blood or clear fluid coming from the incision.  Some redness, swelling, and pain in your incision area.  Some abdominal pain and soreness.  Vaginal bleeding (lochia). Even though you did not have a vaginal delivery, you will still have vaginal bleeding and discharge.  Pelvic cramps.  Fatigue. You may have pain, swelling, and discomfort in the tissue between your vagina and your anus (perineum) if:  Your C-section was unplanned, and you were allowed to labor and push.  An incision was made in the area (episiotomy) or the tissue tore during attempted vaginal delivery. Follow these instructions at home: Incision care   Follow instructions from your health care provider about how to take care of your incision. Make sure you: ? Wash your hands with soap and water before you change your bandage (dressing). If soap and water are not available, use hand sanitizer. ? If you have a dressing, change it or remove it as told by your health care provider. ? Leave stitches (sutures), skin staples, skin glue, or adhesive strips in place. These skin closures may need to stay in place for 2 weeks or longer. If adhesive strip edges start to loosen and curl up, you may trim the loose edges. Do not remove adhesive strips completely unless your health care provider tells you to do that.  Check your incision area every day for signs of infection. Check for: ? More redness, swelling, or pain. ? More fluid or blood. ? Warmth. ? Pus or a bad smell.  Do not take baths, swim, or use a hot tub until your health care provider says it's okay. Ask your health  care provider if you can take showers.  When you cough or sneeze, hug a pillow. This helps with pain and decreases the chance of your incision opening up (dehiscing). Do this until your incision heals. Medicines  Take over-the-counter and prescription medicines only as told by your health care provider.  If you were prescribed an antibiotic medicine, take it as told by your health care provider. Do not stop taking the antibiotic even if you start to feel better.  Do not drive or use heavy machinery while taking prescription pain medicine. Lifestyle  Do not drink alcohol. This is especially important if you are breastfeeding or taking pain medicine.  Do not use any products that contain nicotine or tobacco, such as cigarettes, e-cigarettes, and chewing tobacco. If you need help quitting, ask your health care provider. Eating and drinking  Drink at least 8 eight-ounce glasses of water every day unless told not to by your health care provider. If you breastfeed, you may need to drink even more water.  Eat high-fiber foods every day. These foods may help prevent or relieve constipation. High-fiber foods include: ? Whole grain cereals and breads. ? Brown rice. ? Beans. ? Fresh fruits and vegetables. Activity   If possible, have someone help you care for your baby and help with household activities for at least a few days after you leave the hospital.  Return to your normal activities as told by your health care provider. Ask your health care provider what activities are safe for   you.  Rest as much as possible. Try to rest or take a nap while your baby is sleeping.  Do not lift anything that is heavier than 10 lbs (4.5 kg), or the limit that you were told, until your health care provider says that it is safe.  Talk with your health care provider about when you can engage in sexual activity. This may depend on your: ? Risk of infection. ? How fast you heal. ? Comfort and desire to  engage in sexual activity. General instructions  Do not use tampons or douches until your health care provider approves.  Wear loose, comfortable clothing and a supportive and well-fitting bra.  Keep your perineum clean and dry. Wipe from front to back when you use the toilet.  If you pass a blood clot, save it and call your health care provider to discuss. Do not flush blood clots down the toilet before you get instructions from your health care provider.  Keep all follow-up visits for you and your baby as told by your health care provider. This is important. Contact a health care provider if:  You have: ? A fever. ? Bad-smelling vaginal discharge. ? Pus or a bad smell coming from your incision. ? Difficulty or pain when urinating. ? A sudden increase or decrease in the frequency of your bowel movements. ? More redness, swelling, or pain around your incision. ? More fluid or blood coming from your incision. ? A rash. ? Nausea. ? Little or no interest in activities you used to enjoy. ? Questions about caring for yourself or your baby.  Your incision feels warm to the touch.  Your breasts turn red or become painful or hard.  You feel unusually sad or worried.  You vomit.  You pass a blood clot from your vagina.  You urinate more than usual.  You are dizzy or light-headed. Get help right away if:  You have: ? Pain that does not go away or get better with medicine. ? Chest pain. ? Difficulty breathing. ? Blurred vision or spots in your vision. ? Thoughts about hurting yourself or your baby. ? New pain in your abdomen or in one of your legs. ? A severe headache.  You faint.  You bleed from your vagina so much that you fill more than one sanitary pad in one hour. Bleeding should not be heavier than your heaviest period. Summary  After the procedure, it is common to have pain at your incision site, abdominal cramping, and slight bleeding from your vagina.  Check  your incision area every day for signs of infection.  Tell your health care provider about any unusual symptoms.  Keep all follow-up visits for you and your baby as told by your health care provider. This information is not intended to replace advice given to you by your health care provider. Make sure you discuss any questions you have with your health care provider. Document Revised: 08/21/2017 Document Reviewed: 08/21/2017 Elsevier Patient Education  2020 [employer]  

## 2019-06-18 NOTE — Lactation Note (Signed)
This note was copied from a baby's chart. Lactation Consultation Note  Patient Name: Katie Bowers [id number] Date: 06/18/2019   Baby currently out of room for circumcision.  Mother states bf is better and not using NS.      Maternal Data    Feeding Feeding Type: Breast Fed  LATCH Score Latch: Grasps breast easily, tongue down, lips flanged, rhythmical sucking.  Audible Swallowing: A few with stimulation  Type of Nipple: Everted at rest and after stimulation  Comfort (Breast/Nipple): Soft / non-tender  Hold (Positioning): Assistance needed to correctly position infant at breast and maintain latch.  LATCH Score: 8  Interventions    Lactation Tools Discussed/Used     Consult Status      [name] [facility] 06/18/2019, 10:04 AM

## 2019-06-18 NOTE — Lactation Note (Signed)
This note was copied from a baby's chart. Lactation Consultation Note  Patient Name: Katie Bowers [id number] Date: 06/18/2019 Reason for consult: Follow-up assessment   P1, Baby  46 hours old and back from circumcision.  Mother states she if now giving formula due to her milk supply being not in yet and per MD note. Bilirubin being monitored.   Baby recently bf for 5 mi and was given 5 ml of formula. Mother states she no longer has to use NS. Encouraged her to pump after feedings to help establish her milk supply.     Maternal Data    Feeding Feeding Type: Bottle Fed - Formula Nipple Type: Slow - flow  LATCH Score                   Interventions Interventions: Breast feeding basics reviewed;DEBP  Lactation Tools Discussed/Used     Consult Status Consult Status: Follow-up Date: 06/19/19 Follow-up type: In-patient    [name] [facility] 06/18/2019, 1:34 PM

## 2019-06-19 NOTE — Discharge Summary (Signed)
Postpartum Discharge Summary    Patient Name: Katie Bowers DOB: 1990-10-21 MRN: [id number]  Date of admission: 06/15/2019 Delivering Provider: Truett Mainland   Date of discharge: 06/19/2019  Admitting diagnosis: Indication for care in labor or delivery [O75.9] Intrauterine pregnancy: [redacted]w[redacted]d    Secondary diagnosis:  Active Problems:   Graves' disease   Supervision of other normal pregnancy, antepartum   Hyperthyroidism   Indication for care in labor or delivery   IUD (intrauterine device) in place  Additional problems: none     Discharge diagnosis: Term Pregnancy Delivered                                                                                                Post partum procedures:none  Augmentation: AROM  Complications: None  Hospital course:  Onset of Labor With Unplanned C/S  29y.o. yo G2P1011 at 29w6das admitted in [place] 06/15/2019. Patient had a labor course significant for fetal bradycardia and failed vacuum during second stage of labor. Membrane Rupture Time/Date: 12:50 AM ,06/16/2019   The patient went for cesarean section due to fetal bradycardia and failed vacuum, and delivered a Viable infant,06/16/2019  Details of operation can be found in separate operative note. Had post placental Liletta IUD placed at time of cesarean. Patient had an uncomplicated postpartum course.  She is ambulating,tolerating a regular diet, passing flatus, and urinating well. Discharge was delayed one day due to problems with infant jaundice.  Patient is discharged home in stable condition 06/19/19. Delivery time: 2:52 PM    Magnesium Sulfate received: No BMZ received: No Rhophylac:N/A MMR:N/A Transfusion:No  Physical exam  Vitals:   06/18/19 0540 06/18/19 1432 06/18/19 2259 06/19/19 0540  BP: 122/87 114/78 116/79 117/81  Pulse: 79   84  Resp: _0 Temp: 98.1 F (36.7 C) 98.2 F (36.8 C) 98.7 F (37.1 C) 98 F (36.7 C)  TempSrc: Oral Oral Oral Axillary   SpO2:   98%    General: alert, cooperative and no distress Lochia: appropriate Uterine Fundus: firm Incision: Healing well with no significant drainage, Dressing is clean, dry, and intact DVT Evaluation: No evidence of DVT seen on physical exam. No significant calf/ankle edema. Labs: Lab Results  Component Value Date   WBC 16.7 (H) 06/17/2019   HGB 9.9 (L) 06/17/2019   HCT 31.1 (L) 06/17/2019   MCV 93.7 06/17/2019   PLT 193 06/17/2019   CMP Latest Ref Rng & Units 12/21/2015  Glucose 65 - 99 mg/dL 88  BUN 6 - 20 mg/dL 9  Creatinine 0.44 - 1.00 mg/dL 0.94  Sodium 135 - 145 mmol/L 141  Potassium 3.5 - 5.1 mmol/L 3.6  Chloride 101 - 111 mmol/L 111  CO2 22 - 32 mmol/L 24  Calcium 8.9 - 10.3 mg/dL 9.5  Total Protein 6.5 - 8.1 g/dL 6.6  Total Bilirubin 0.3 - 1.2 mg/dL 0.8  Alkaline Phos 38 - 126 U/L 62  AST 15 - 41 U/L 29  ALT 14 - 54 U/L 16   Edinburgh Score: Edinburgh Postnatal Depression Scale Screening Tool 06/16/2019  I  have been able to laugh and see the funny side of things. 0  I have looked forward with enjoyment to things. 0  I have blamed myself unnecessarily when things went wrong. 0  I have been anxious or worried for no good reason. 0  I have felt scared or panicky for no good reason. 0  Things have been getting on top of me. 0  I have been so unhappy that I have had difficulty sleeping. 0  I have felt sad or miserable. 0  I have been so unhappy that I have been crying. 0  The thought of harming myself has occurred to me. 0  Edinburgh Postnatal Depression Scale Total 0    Discharge instruction: per After Visit Summary and "Baby and Me Booklet".  After visit meds:  Allergies as of 06/19/2019      Reactions   Crab [shellfish Allergy]    Pt states allergy is just to crab and trout   Methimazole [methimazole] Swelling   Face and tongue.       Medication List    STOP taking these medications   valACYclovir 500 MG tablet Commonly known as: VALTREX      TAKE these medications   acetaminophen 325 MG tablet Commonly known as: TYLENOL Take 2 tablets (650 mg total) by mouth every 4 (four) hours as needed.   [place] Supp Sm Misc Wear as directed.   docusate sodium 100 MG capsule Commonly known as: COLACE Take 1 capsule (100 mg total) by mouth 2 (two) times daily as needed.   ferrous sulfate 325 (65 FE) MG tablet Take 1 tablet (325 mg total) by mouth daily with breakfast.   ibuprofen 600 MG tablet Commonly known as: ADVIL Take 1 tablet (600 mg total) by mouth every 6 (six) hours as needed.   oxyCODONE 5 MG immediate release tablet Commonly known as: Roxicodone Take 1 tablet (5 mg total) by mouth every 8 (eight) hours as needed.   pantoprazole 40 MG tablet Commonly known as: Protonix Take 1 tablet (40 mg total) by mouth daily.   polyethylene glycol powder 17 GM/SCOOP powder Commonly known as: GLYCOLAX/MIRALAX Take 17 g by mouth daily as needed.   prenatal multivitamin Tabs tablet Take 1 tablet by mouth daily at 12 noon.   promethazine 25 MG tablet Commonly known as: PHENERGAN Take 1 tablet (25 mg total) by mouth every 6 (six) hours as needed for nausea or vomiting.       Diet: routine diet  Activity: Advance as tolerated. Pelvic rest for 6 weeks.   Outpatient follow up:2 weeks Follow up Appt: Future Appointments  Date Time Provider [place]  06/30/2019  1:10 PM [place] None  07/15/2019  1:00 PM Sloan Leiter, MD [facility] None   Follow up Visit:    Please schedule this patient for Postpartum visit in: 4 weeks with the following provider: Any provider In-Person For C/S patients schedule nurse incision check in weeks 2 weeks: yes Low risk pregnancy complicated by: none Delivery mode:  CS Anticipated Birth Control:  PP IUD Placed PP Procedures needed: Incision check , string check, TSH/T3/T4 check Schedule Integrated BH visit: no     Newborn Data: Live born female  Birth  Weight:  3790 grams APGAR: 107, 9  Newborn Delivery   Birth date/time: 06/16/2019 14:52:00 Delivery type:       Baby Feeding: Breast Disposition:home with mother   06/19/2019 Clarnce Flock, MD

## 2019-06-19 NOTE — Lactation Note (Addendum)
This note was copied from a baby's chart. Lactation Consultation Note  Patient Name: Katie Bowers ZYSAY'T Date: 06/19/2019 Reason for consult: Follow-up assessment   P1, Baby 65 hours old.  Mother has been doing some breastfeeding and supplementing w/ formula Mother hand expressed drops prior to latching.   Worked with mother to latch baby who seems to be starting to have nipple confusion due to frequent bottles. He opened wide and would not sustain latch. Demonstrated how to compress breast and guide baby deep on breast and after a few attempts in various positions he was able to sustain latch in football hold. Recommend mother breastfeed on demand both breasts per session to help establish her milk supply before offering formula to get baby used to breastfeeding. Discussed supply and demand.  Also reminded mother because she is giving formula she should be pumping in addition.  Mother has not pumped since last night. She states her mother is purchasing her a DEBP for home use. Feed on demand with cues.  Goal 8-12+ times per day after first 24 hrs.  Place baby STS if not cueing. If giving formula be sure to pump after feedings.  Reviewed engorgement care and monitoring voids/stools.    Maternal Data Has patient been taught Hand Expression?: Yes  Feeding Feeding Type: Breast Fed  LATCH Score Latch: Grasps breast easily, tongue down, lips flanged, rhythmical sucking.  Audible Swallowing: A few with stimulation  Type of Nipple: Everted at rest and after stimulation  Comfort (Breast/Nipple): Soft / non-tender  Hold (Positioning): Assistance needed to correctly position infant at breast and maintain latch.  LATCH Score: 8  Interventions Interventions: Breast feeding basics reviewed;Assisted with latch;Hand express;Breast compression;Adjust position;Support pillows;Position options;DEBP;Hand pump  Lactation Tools Discussed/Used     Consult Status Consult Status:  Complete Date: 06/19/19    [name] [facility] 06/19/2019, 8:20 AM

## 2019-06-22 ENCOUNTER — Other Ambulatory Visit (HOSPITAL_COMMUNITY): Payer: Medicaid Other

## 2019-06-24 ENCOUNTER — Inpatient Hospital Stay (HOSPITAL_COMMUNITY): Payer: Medicaid Other

## 2019-06-24 ENCOUNTER — Inpatient Hospital Stay (HOSPITAL_COMMUNITY): Admission: AD | Admit: 2019-06-24 | Payer: Medicaid Other | Source: Home / Self Care | Admitting: Family Medicine

## 2019-06-30 ENCOUNTER — Ambulatory Visit (INDEPENDENT_AMBULATORY_CARE_PROVIDER_SITE_OTHER): Payer: Medicaid Other

## 2019-06-30 ENCOUNTER — Other Ambulatory Visit: Payer: Self-pay

## 2019-06-30 VITALS — BP 117/71 | HR 56 | Ht 64.0 in | Wt 138.0 lb

## 2019-06-30 DIAGNOSIS — Z4889 Encounter for other specified surgical aftercare: Secondary | ICD-10-CM

## 2019-06-30 DIAGNOSIS — Z5189 Encounter for other specified aftercare: Secondary | ICD-10-CM

## 2019-06-30 NOTE — Progress Notes (Signed)
Subjective:     Katie Bowers is a 29 y.o. female who presents to the clinic 2 weeks status post CSection for incision check. Eating a regular diet without difficulty. Bowel movements are normal. The patient is not having any pain.  Review of Systems Pertinent items are noted in HPI.    Objective:    BP 117/71   Pulse (!) 56   Ht 5\' 4"  (1.626 m)   Wt 138 lb (62.6 kg)   Breastfeeding Yes   BMI 23.69 kg/m  General:  alert  Abdomen: soft, bowel sounds active, non-tender  Incision:   healing well, no drainage, no erythema, no hernia, no seroma, no swelling, no dehiscence, incision well approximated     Assessment:    Doing well postoperatively.   Plan:    1. Continue any current medications. 2. Wound care discussed. 3. Activity restrictions: none 4. Anticipated return to work: Postpartum Appt. 07/15/2019 decision will be made then 5. Follow up: 2 weeks for Postpartum Visit.   07/17/2019, RMA

## 2019-06-30 NOTE — Progress Notes (Signed)
Patient seen and assessed by nursing staff during this encounter. I have reviewed the chart and agree with the documentation and plan. I have also made any necessary editorial changes.  Joselyn Arrow, MD 06/30/2019 2:30 PM

## 2019-07-11 ENCOUNTER — Ambulatory Visit: Payer: Medicaid Other | Attending: Internal Medicine

## 2019-07-11 DIAGNOSIS — Z23 Encounter for immunization: Secondary | ICD-10-CM

## 2019-07-11 NOTE — Progress Notes (Signed)
   Covid-19 Vaccination Clinic  Name:  Katie Bowers    MRN: [id number] DOB: 11/28/1990  07/11/2019  Ms. Alonge was observed post Covid-19 immunization for 30 minutes based on pre-vaccination screening without incident. She was provided with Vaccine Information Sheet and instruction to access the V-Safe system.   Ms. Goh was instructed to call 911 with any severe reactions post vaccine: Marland Kitchen Difficulty breathing  . Swelling of face and throat  . A fast heartbeat  . A bad rash all over body  . Dizziness and weakness   Immunizations Administered    Name Date Dose VIS Date Route   Pfizer COVID-19 Vaccine 07/11/2019 11:38 AM 0.3 mL 04/22/2018 Intramuscular   Manufacturer: [employer], [employer]   Lot: [id number]   NDC: [address]-9

## 2019-07-15 ENCOUNTER — Ambulatory Visit (INDEPENDENT_AMBULATORY_CARE_PROVIDER_SITE_OTHER): Payer: Medicaid Other | Admitting: Obstetrics and Gynecology

## 2019-07-15 ENCOUNTER — Other Ambulatory Visit: Payer: Self-pay

## 2019-07-15 ENCOUNTER — Encounter: Payer: Self-pay | Admitting: Obstetrics and Gynecology

## 2019-07-15 DIAGNOSIS — F419 Anxiety disorder, unspecified: Secondary | ICD-10-CM

## 2019-07-15 DIAGNOSIS — Z30431 Encounter for routine checking of intrauterine contraceptive device: Secondary | ICD-10-CM

## 2019-07-15 DIAGNOSIS — Z9889 Other specified postprocedural states: Secondary | ICD-10-CM

## 2019-07-15 NOTE — Progress Notes (Signed)
Obstetrics/Postpartum Visit  Appointment Date: 07/15/2019  OBGYN Clinic: [facility]  Primary Care Provider: Patient, No Pcp Per  Chief Complaint:  Chief Complaint  Patient presents with  . Postpartum Care    History of Present Illness: Katie Bowers is a 29 y.o. African-American G2P1011 (No LMP recorded.), seen for the above chief complaint. Her past medical history is significant for HSV, hyperthyroidism.   She is s/p stat primary CS for arrest of descent, fetal bradycardia and failed vacuum on 06/16/19 at 39w weeks; she was discharged to home on POD#2. Pregnancy complicated by n/a.  Complains of being anxious about whether she is doing a good job as a parent.  Vaginal bleeding or discharge: No  Breast or formula feeding: bottle Intercourse: No  Contraception: IUD placed at the time of c-section PP depression s/s: No  Any bowel or bladder issues: No  Pap smear: no abnormalities (date: 11/2018)  Review of Systems: Positive for n/a.   Her 12 point review of systems is negative or as noted in the History of Present Illness.  Patient Active Problem List   Diagnosis Date Noted  . IUD (intrauterine device) in place 06/18/2019  . Indication for care in labor or delivery 06/16/2019  . LGA (large for gestational age) fetus affecting management of mother 06/03/2019  . Supervision of other normal pregnancy, antepartum 11/26/2018  . Hyperthyroidism   . H/O herpes genitalis   . Pure hypercholesterolemia 02/12/2011  . Graves' disease 02/05/2011    Medications Anjani D. Gershon Cull "T.K." had no medications administered during this visit. Current Outpatient Medications  Medication Sig Dispense Refill  . acetaminophen (TYLENOL) 325 MG tablet Take 2 tablets (650 mg total) by mouth every 4 (four) hours as needed. (Patient not taking: Reported on 07/15/2019) 30 tablet 0  . docusate sodium (COLACE) 100 MG capsule Take 1 capsule (100 mg total) by mouth 2 (two) times daily as needed. (Patient not  taking: Reported on 07/15/2019) 30 capsule 2  . Elastic Bandages & Supports (COMFORT FIT MATERNITY SUPP SM) MISC Wear as directed. (Patient not taking: Reported on 07/15/2019) 1 each 0  . ferrous sulfate 325 (65 FE) MG tablet Take 1 tablet (325 mg total) by mouth daily with breakfast. (Patient not taking: Reported on 07/15/2019) 30 tablet 3  . ibuprofen (ADVIL) 600 MG tablet Take 1 tablet (600 mg total) by mouth every 6 (six) hours as needed. (Patient not taking: Reported on 07/15/2019) 30 tablet 0  . oxyCODONE (ROXICODONE) 5 MG immediate release tablet Take 1 tablet (5 mg total) by mouth every 8 (eight) hours as needed. (Patient not taking: Reported on 07/15/2019) 20 tablet 0  . pantoprazole (PROTONIX) 40 MG tablet Take 1 tablet (40 mg total) by mouth daily. (Patient not taking: Reported on 07/15/2019) 30 tablet 2  . polyethylene glycol powder (GLYCOLAX/MIRALAX) 17 GM/SCOOP powder Take 17 g by mouth daily as needed. (Patient not taking: Reported on 07/15/2019) 510 g 1  . Prenatal Vit-Fe Fumarate-FA (PRENATAL MULTIVITAMIN) TABS tablet Take 1 tablet by mouth daily at 12 noon.    . promethazine (PHENERGAN) 25 MG tablet Take 1 tablet (25 mg total) by mouth every 6 (six) hours as needed for nausea or vomiting. (Patient not taking: Reported on 07/15/2019) 30 tablet 2   No current facility-administered medications for this visit.    Allergies Crab [shellfish allergy] and Methimazole [methimazole]  Physical Exam:  BP 113/77   Pulse 69   Wt 132 lb 6.4 oz (60.1 kg)   BMI  22.73 kg/m  Body mass index is 22.73 kg/m. General appearance: Well nourished, well developed female in no acute distress.  Cardiovascular: regular rate and rhythm Respiratory:  Normal respiratory effort Abdomen: diffusely non tender to palpation, non distended, well healed pfannenstiel incision Breasts: not examined. Neuro/Psych:  Normal mood and affect.  Skin:  Warm and dry.   Pelvic exam: is not limited by body habitus EGBUS:  within normal limits Vagina: within normal limits and with None blood in the vault, Cervix:  no lesions or cervical motion tenderness, IUD strings seen just protruding from cervix   PP Depression Screening:   Edinburgh Postnatal Depression Scale - 07/15/19 1311      Edinburgh Postnatal Depression Scale:  In the Past 7 Days   I have been able to laugh and see the funny side of things.  0    I have looked forward with enjoyment to things.  0    I have blamed myself unnecessarily when things went wrong.  2    I have been anxious or worried for no good reason.  0    I have felt scared or panicky for no good reason.  0    Things have been getting on top of me.  0    I have been so unhappy that I have had difficulty sleeping.  0    I have felt sad or miserable.  0    I have been so unhappy that I have been crying.  0    The thought of harming myself has occurred to me.  0    Edinburgh Postnatal Depression Scale Total  2       Assessment: Patient is a 29 y.o. G2P1011 who is 4 weeks post partum from a stat primary c-section. She is doing well, some anxiety about being first time mom.  Plan:  1. Postpartum state Doing well  2. Postoperative state  3. IUD check up Strings noted  4. Anxiety Reassured her she is doing well, offered referral to [facility], she is agreeable - Ambulatory referral to Integrated Behavioral Health   Essential components of care per ACOG recommendations:  1.  Mood and well being: Patient with negative depression screening today. Reviewed local resources for support.  - Patient does not use tobacco.  - hx of drug use? No    2. Infant care and feeding:  -Patient currently breastmilk feeding? No  -Social determinants of health (SDOH) reviewed in EPIC. No concerns  3. Sexuality, contraception and birth spacing - Patient does not want a pregnancy in the next year.  Desired family size is unknown children.  - Reviewed forms of contraception in tiered fashion.  Patient has IUD in place.   - Discussed birth spacing of 18 months  4. Sleep and fatigue -Encouraged family/partner/community support of 4 hrs of uninterrupted sleep to help with mood and fatigue  5. Physical Recovery  - Discussed patients delivery and complications - Patient had a stat c-section, healing reviewed. Patient expressed understanding - Patient has urinary incontinence? No  - Patient is safe to resume physical and sexual activity  6.  Health Maintenance - Last pap smear done 11/26/2018 and was normal.    Conan Bowens, MD Center for [facility], [facility]

## 2019-07-21 ENCOUNTER — Ambulatory Visit (INDEPENDENT_AMBULATORY_CARE_PROVIDER_SITE_OTHER): Payer: Medicaid Other | Admitting: Licensed Clinical Social Worker

## 2019-07-21 DIAGNOSIS — F4322 Adjustment disorder with anxiety: Secondary | ICD-10-CM | POA: Diagnosis not present

## 2019-07-22 NOTE — BH Specialist Note (Signed)
Integrated Behavioral Health via Telemedicine Video Visit  07/22/2019 Katie Bowers [id number]  Number of Integrated Behavioral Health visits: 1 Session Start time: 1:00pm  Session End time: 1:17pm Total time: 17 mins via mychart video   Referring Provider: K. Earlene Plater MD Type of Visit: Video Patient/Family location: Home  [facility] Provider location: [facility]  All persons participating in visit: Katie Bowers & Katie Bowers  Confirmed patient's address: yes Confirmed patient's phone number: yes Any changes to demographics: no  Confirmed patient's insurance: no Any changes to patient's insurance: no  Discussed confidentiality: yes   I connected with Katie Bowers and/or Katie Bowers's n/a by a video enabled telemedicine application and verified that I am speaking with the correct person using two identifiers.     I discussed the limitations of evaluation and management by telemedicine and the availability of in person appointments.  I discussed that the purpose of this visit is to provide behavioral health care while limiting exposure to the novel coronavirus.   Discussed there is a possibility of technology failure and discussed alternative modes of communication if that failure occurs.  I discussed that engaging in this video visit, they consent to the provision of behavioral healthcare and the services will be billed under their insurance.  Patient and/or legal guardian expressed understanding and consented to video visit: yes  PRESENTING CONCERNS: Patient and/or family reports the following symptoms/concerns:Anxiety and parenting concerns Duration of problem:One month ; Severity of problem: mild  STRENGTHS (Protective Factors/Coping Skills): Ms. Popovich reports mother and immediate family members are supportive  GOALS ADDRESSED: Patient will: 1.  Reduce symptoms of: worry and feeling inadequate with parenting  2.  Increase knowledge of adjustment disorder and parenting skills  3.   Demonstrate ability to: self manage symptoms   INTERVENTIONS: Interventions utilized:   Standardized Assessments completed: Edinburgh Postnatal depression screening 07/15/2019   ASSESSMENT: Patient currently experiencing adjustment disorder with anxious mood. Ms. Speece reports feeling unsure and often questions whether she is doing a good job with her newborn son. Ms. Insco reports bonding well, proper sleeping habits and adequate support system.    Patient may benefit from integrated behavioral health   PLAN: 1. Follow up with behavioral health clinician on : as needed 2. Behavioral recommendations: practice deep breathing and stress reducing activities, continue prioritizing positive sleeping habits.   3. Referral(s): None   I discussed the assessment and treatment plan with the patient and/or parent/guardian. They were provided an opportunity to ask questions and all were answered. They agreed with the plan and demonstrated an understanding of the instructions.   They were advised to call back or seek an in-person evaluation if the symptoms worsen or if the condition fails to improve as anticipated.  Gwyndolyn Saxon

## 2019-08-03 ENCOUNTER — Ambulatory Visit: Payer: Medicaid Other

## 2019-08-08 ENCOUNTER — Ambulatory Visit: Payer: Medicaid Other | Attending: Internal Medicine

## 2019-08-08 DIAGNOSIS — Z23 Encounter for immunization: Secondary | ICD-10-CM

## 2019-08-08 NOTE — Progress Notes (Signed)
   Covid-19 Vaccination Clinic  Name:  Katie Bowers    MRN: [id number] DOB: 08/18/1990  08/08/2019  [name] was observed post Covid-19 immunization for 15 minutes without incident. She was provided with Vaccine Information Sheet and instruction to access the V-Safe system.   Ms. Saline was instructed to call 911 with any severe reactions post vaccine: Marland Kitchen Difficulty breathing  . Swelling of face and throat  . A fast heartbeat  . A bad rash all over body  . Dizziness and weakness   Immunizations Administered    Name Date Dose VIS Date Route   Pfizer COVID-19 Vaccine 08/08/2019 11:34 AM 0.3 mL 04/22/2018 Intramuscular   Manufacturer: [employer], [employer]   Lot: [id number]   NDC: [address]-8

## 2020-02-22 ENCOUNTER — Ambulatory Visit: Payer: Medicaid Other | Admitting: Obstetrics and Gynecology

## 2020-03-25 DIAGNOSIS — F4322 Adjustment disorder with anxiety: Secondary | ICD-10-CM | POA: Diagnosis not present

## 2020-04-01 DIAGNOSIS — F4322 Adjustment disorder with anxiety: Secondary | ICD-10-CM | POA: Diagnosis not present

## 2020-05-06 DIAGNOSIS — F4322 Adjustment disorder with anxiety: Secondary | ICD-10-CM | POA: Diagnosis not present

## 2020-06-03 DIAGNOSIS — F4322 Adjustment disorder with anxiety: Secondary | ICD-10-CM | POA: Diagnosis not present

## 2020-07-01 ENCOUNTER — Ambulatory Visit (INDEPENDENT_AMBULATORY_CARE_PROVIDER_SITE_OTHER): Payer: Medicaid Other | Admitting: Obstetrics

## 2020-07-01 ENCOUNTER — Encounter: Payer: Self-pay | Admitting: Obstetrics

## 2020-07-01 ENCOUNTER — Other Ambulatory Visit (HOSPITAL_COMMUNITY)
Admission: RE | Admit: 2020-07-01 | Discharge: 2020-07-01 | Disposition: A | Discharge status: Home / Self Care | Payer: BC Managed Care – PPO | Source: Ambulatory Visit | Attending: Obstetrics | Admitting: Obstetrics

## 2020-07-01 ENCOUNTER — Other Ambulatory Visit: Payer: Self-pay

## 2020-07-01 VITALS — BP 102/70 | HR 78 | Ht 65.0 in | Wt 147.0 lb

## 2020-07-01 DIAGNOSIS — Z113 Encounter for screening for infections with a predominantly sexual mode of transmission: Secondary | ICD-10-CM

## 2020-07-01 DIAGNOSIS — Z01419 Encounter for gynecological examination (general) (routine) without abnormal findings: Secondary | ICD-10-CM | POA: Diagnosis not present

## 2020-07-01 DIAGNOSIS — N898 Other specified noninflammatory disorders of vagina: Secondary | ICD-10-CM | POA: Insufficient documentation

## 2020-07-01 DIAGNOSIS — N946 Dysmenorrhea, unspecified: Secondary | ICD-10-CM

## 2020-07-01 MED ORDER — IBUPROFEN 800 MG PO TABS: 800.0000 mg | ORAL_TABLET | Freq: Three times a day (TID) | ORAL | 5 refills | Status: DC | PRN

## 2020-07-01 NOTE — Progress Notes (Signed)
GYN presents for AEX/PAP/STD screening.  C/o bump on CS scar.  Wants her IUD strings to be checked.

## 2020-07-01 NOTE — Progress Notes (Signed)
Subjective:        Katie Bowers is a 30 y.o. female here for a routine exam.  Current complaints: Vaginal discharge.    Personal health questionnaire:  Is patient [name], have a family history of breast and/or ovarian cancer: no Is there a family history of uterine cancer diagnosed at age < 34, gastrointestinal cancer, urinary tract cancer, family member who is a Personnel officer syndrome-associated carrier: no Is the patient overweight and hypertensive, family history of diabetes, personal history of gestational diabetes, preeclampsia or PCOS: no Is patient over 62, have PCOS,  family history of premature CHD under age 62, diabetes, smoke, have hypertension or peripheral artery disease:  no At any time, has a partner hit, kicked or otherwise hurt or frightened you?: no Over the past 2 weeks, have you felt down, depressed or hopeless?: no Over the past 2 weeks, have you felt little interest or pleasure in doing things?:no   Gynecologic History No LMP recorded. (Menstrual status: IUD). Contraception: IUD Last Pap: 2020. Results were: normal Last mammogram: n/a. Results were: n/a  Obstetric History OB History  Gravida Para Term Preterm AB Living  2 1 1   1 1   SAB IAB Ectopic Multiple Live Births    1   0 1    # Outcome Date GA Lbr Len/2nd Weight Sex Delivery Anes PTL Lv  2 Term 06/16/19 [redacted]w[redacted]d 14:29 / 02:23 8 lb 5.7 oz (3.79 kg) M CS-LTranv EPI  LIV  1 IAB 2010            Past Medical History:  Diagnosis Date  . ASCUS (atypical squamous cells of undetermined significance) on Pap smear 12/2009   with high grade HPV  . Chlamydia 06/2009, 12/2009  . Grave's disease 06/2010   sees Dr. 07/2010  . Hyperthyroidism   . 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 CESAREAN SECTION N/A 06/16/2019   Procedure: CESAREAN SECTION;  Surgeon: 06/18/2019, DO;  Location: [facility];  Service: Obstetrics;   Laterality: N/A;  . NO PAST SURGERIES       Current Outpatient Medications:  .  ibuprofen (ADVIL) 800 MG tablet, Take 1 tablet (800 mg total) by mouth every 8 (eight) hours as needed., Disp: 30 tablet, Rfl: 5 .  acetaminophen (TYLENOL) 325 MG tablet, Take 2 tablets (650 mg total) by mouth every 4 (four) hours as needed. (Patient not taking: Reported on 07/15/2019), Disp: 30 tablet, Rfl: 0 .  docusate sodium (COLACE) 100 MG capsule, Take 1 capsule (100 mg total) by mouth 2 (two) times daily as needed. (Patient not taking: Reported on 07/15/2019), Disp: 30 capsule, Rfl: 2 .  Elastic Bandages & Supports (COMFORT FIT MATERNITY SUPP SM) MISC, Wear as directed. (Patient not taking: Reported on 07/15/2019), Disp: 1 each, Rfl: 0 .  ferrous sulfate 325 (65 FE) MG tablet, Take 1 tablet (325 mg total) by mouth daily with breakfast. (Patient not taking: Reported on 07/15/2019), Disp: 30 tablet, Rfl: 3 .  pantoprazole (PROTONIX) 40 MG tablet, Take 1 tablet (40 mg total) by mouth daily. (Patient not taking: Reported on 07/15/2019), Disp: 30 tablet, Rfl: 2 .  polyethylene glycol powder (GLYCOLAX/MIRALAX) 17 GM/SCOOP powder, Take 17 g by mouth daily as needed. (Patient not taking: Reported on 07/15/2019), Disp: 510 g, Rfl: 1 .  Prenatal Vit-Fe Fumarate-FA (PRENATAL MULTIVITAMIN) TABS tablet, Take 1 tablet by mouth daily at 12 noon., Disp: ,  Rfl:  .  promethazine (PHENERGAN) 25 MG tablet, Take 1 tablet (25 mg total) by mouth every 6 (six) hours as needed for nausea or vomiting. (Patient not taking: Reported on 07/15/2019), Disp: 30 tablet, Rfl: 2 Allergies  Allergen Reactions  . Crab [Shellfish Allergy]     Pt states allergy is just to crab and trout   . Methimazole [Methimazole] Swelling    Face and tongue.     Social History   Tobacco Use  . Smoking status: Never Smoker  . Smokeless tobacco: Never Used  Substance Use Topics  . Alcohol use: Not Currently    Alcohol/week: 0.0 standard drinks    Comment: 2  drinks every other month.    Family History  Problem Relation Age of Onset  . Hyperlipidemia Mother   . Hypothyroidism Mother   . Cancer Maternal Uncle        lung cancer (smoker)  . Hyperthyroidism Paternal Aunt   . Hypertension Maternal Grandmother   . Hypothyroidism Paternal Grandmother       Review of Systems  Constitutional: negative for fatigue and weight loss Respiratory: negative for cough and wheezing Cardiovascular: negative for chest pain, fatigue and palpitations Gastrointestinal: negative for abdominal pain and change in bowel habits Musculoskeletal:negative for myalgias Neurological: negative for gait problems and tremors Behavioral/Psych: negative for abusive relationship, depression Endocrine: negative for temperature intolerance    Genitourinary: positive for vaginal discharge.  negative for abnormal menstrual periods, genital lesions, hot flashes, sexual problems  Integument/breast: negative for breast lump, breast tenderness, nipple discharge and skin lesion(s)    Objective:       BP 102/70   Pulse 78   Ht 5\' 5"  (1.651 m)   Wt 147 lb (66.7 kg)   BMI 24.46 kg/m  General:   alert  Skin:   no rash or abnormalities  Lungs:   clear to auscultation bilaterally  Heart:   regular rate and rhythm, S1, S2 normal, no murmur, click, rub or gallop  Breasts:   normal without suspicious masses, skin or nipple changes or axillary nodes  Abdomen:  normal findings: no organomegaly, soft, non-tender and no hernia  Pelvis:  External genitalia: normal general appearance Urinary system: urethral meatus normal and bladder without fullness, nontender Vaginal: normal without tenderness, induration or masses Cervix: normal appearance Adnexa: normal bimanual exam Uterus: anteverted and non-tender, normal size   Lab Review Urine pregnancy test Labs reviewed yes Radiologic studies reviewed no  I have spent a total of 20 minutes of face-to-face time, excluding clinical  staff time, reviewing notes and preparing to see patient, ordering tests and/or medications, and counseling the patient.  Assessment:     1. Encounter for gynecological examination with Papanicolaou smear of cervix Rx: - Cytology - PAP( [place])  2. Dysmenorrhea Rx: - ibuprofen (ADVIL) 800 MG tablet; Take 1 tablet (800 mg total) by mouth every 8 (eight) hours as needed.  Dispense: 30 tablet; Refill: 5  3. Vaginal discharge Rx: - Cervicovaginal ancillary only( [place])  4. Screen for STD (sexually transmitted disease) Rx: - RPR+HBsAg+HCVAb+...     Plan:    Education reviewed: calcium supplements, depression evaluation, low fat, low cholesterol diet, safe sex/STD prevention, self breast exams and weight bearing exercise. Contraception: IUD. Follow up in: 1 year.   Meds ordered this encounter  Medications  . ibuprofen (ADVIL) 800 MG tablet    Sig: Take 1 tablet (800 mg total) by mouth every 8 (eight) hours as needed.  Dispense:  30 tablet    Refill:  5   Orders Placed This Encounter  Procedures  . RPR+HBsAg+HCVAb+...    Brock Bad, MD 07/01/2020 10:55 AM

## 2020-07-02 LAB — RPR+HBSAG+HCVAB+...
HIV Screen 4th Generation wRfx: NONREACTIVE
Hep C Virus Ab: <0.1 s/co ratio (ref 0.0–0.9)
Hepatitis B Surface Ag: NEGATIVE
RPR Ser Ql: NONREACTIVE

## 2020-07-04 ENCOUNTER — Other Ambulatory Visit: Payer: Self-pay | Admitting: Obstetrics

## 2020-07-04 DIAGNOSIS — B9689 Other specified bacterial agents as the cause of diseases classified elsewhere: Secondary | ICD-10-CM

## 2020-07-04 DIAGNOSIS — N760 Acute vaginitis: Secondary | ICD-10-CM

## 2020-07-04 LAB — CERVICOVAGINAL ANCILLARY ONLY
Bacterial Vaginitis (gardnerella): POSITIVE — AB
Candida Glabrata: NEGATIVE
Candida Vaginitis: NEGATIVE
Chlamydia: NEGATIVE
Comment: NEGATIVE
Comment: NEGATIVE
Comment: NEGATIVE
Comment: NEGATIVE
Comment: NEGATIVE
Comment: NORMAL
Neisseria Gonorrhea: NEGATIVE
Trichomonas: NEGATIVE

## 2020-07-04 LAB — CYTOLOGY - PAP
Comment: NEGATIVE
Diagnosis: NEGATIVE
High risk HPV: NEGATIVE

## 2020-07-04 MED ORDER — METRONIDAZOLE 500 MG PO TABS: 500.0000 mg | ORAL_TABLET | Freq: Two times a day (BID) | ORAL | 2 refills | Status: DC

## 2020-08-12 DIAGNOSIS — F4322 Adjustment disorder with anxiety: Secondary | ICD-10-CM | POA: Diagnosis not present

## 2021-01-03 ENCOUNTER — Other Ambulatory Visit (HOSPITAL_COMMUNITY)
Admission: RE | Admit: 2021-01-03 | Discharge: 2021-01-03 | Disposition: A | Discharge status: Home / Self Care | Payer: BC Managed Care – PPO | Source: Ambulatory Visit | Attending: Obstetrics and Gynecology | Admitting: Obstetrics and Gynecology

## 2021-01-03 ENCOUNTER — Ambulatory Visit (INDEPENDENT_AMBULATORY_CARE_PROVIDER_SITE_OTHER): Payer: BC Managed Care – PPO

## 2021-01-03 ENCOUNTER — Other Ambulatory Visit: Payer: Self-pay

## 2021-01-03 VITALS — BP 111/72 | HR 69 | Ht 64.0 in | Wt 150.0 lb

## 2021-01-03 DIAGNOSIS — N898 Other specified noninflammatory disorders of vagina: Secondary | ICD-10-CM

## 2021-01-03 DIAGNOSIS — Z113 Encounter for screening for infections with a predominantly sexual mode of transmission: Secondary | ICD-10-CM

## 2021-01-03 NOTE — Progress Notes (Signed)
SUBJECTIVE:  30 y.o. female complains of clear vaginal discharge. Pt states she has issues with recurrent BV. Denies abnormal vaginal bleeding or significant pelvic pain or fever. No UTI symptoms. Denies history of known exposure to STD.  No LMP recorded (exact date). (Menstrual status: IUD).  OBJECTIVE:  She appears well, afebrile. Urine dipstick: not done.  ASSESSMENT:  Vaginal Discharge    PLAN:  GC, chlamydia, trichomonas, BVAG, CVAG probe sent to lab. Also request STD bloodwork. Treatment: To be determined once lab results are received ROV prn if symptoms persist or worsen.

## 2021-01-04 LAB — CERVICOVAGINAL ANCILLARY ONLY
Bacterial Vaginitis (gardnerella): NEGATIVE
Candida Glabrata: NEGATIVE
Candida Vaginitis: NEGATIVE
Chlamydia: NEGATIVE
Comment: NEGATIVE
Comment: NEGATIVE
Comment: NEGATIVE
Comment: NEGATIVE
Comment: NEGATIVE
Comment: NORMAL
Neisseria Gonorrhea: NEGATIVE
Trichomonas: NEGATIVE

## 2021-01-04 LAB — RPR+HBSAG+HCVAB+...
HIV Screen 4th Generation wRfx: NONREACTIVE
Hep C Virus Ab: <0.1 s/co ratio (ref 0.0–0.9)
Hepatitis B Surface Ag: NEGATIVE
RPR Ser Ql: NONREACTIVE

## 2022-01-05 DIAGNOSIS — F411 Generalized anxiety disorder: Secondary | ICD-10-CM | POA: Diagnosis not present

## 2022-01-05 DIAGNOSIS — E041 Nontoxic single thyroid nodule: Secondary | ICD-10-CM | POA: Diagnosis not present

## 2022-04-05 ENCOUNTER — Ambulatory Visit (INDEPENDENT_AMBULATORY_CARE_PROVIDER_SITE_OTHER): Payer: BC Managed Care – PPO | Admitting: Obstetrics and Gynecology

## 2022-04-05 ENCOUNTER — Ambulatory Visit: Payer: BC Managed Care – PPO

## 2022-04-05 ENCOUNTER — Other Ambulatory Visit (HOSPITAL_COMMUNITY)
Admission: RE | Admit: 2022-04-05 | Discharge: 2022-04-05 | Disposition: A | Discharge status: Home / Self Care | Payer: BC Managed Care – PPO | Source: Ambulatory Visit | Attending: Obstetrics and Gynecology | Admitting: Obstetrics and Gynecology

## 2022-04-05 ENCOUNTER — Encounter: Payer: Self-pay | Admitting: Obstetrics and Gynecology

## 2022-04-05 VITALS — BP 119/89 | HR 79 | Ht 65.0 in | Wt 163.6 lb

## 2022-04-05 DIAGNOSIS — Z3009 Encounter for other general counseling and advice on contraception: Secondary | ICD-10-CM | POA: Diagnosis not present

## 2022-04-05 DIAGNOSIS — Z113 Encounter for screening for infections with a predominantly sexual mode of transmission: Secondary | ICD-10-CM | POA: Diagnosis not present

## 2022-04-05 NOTE — Progress Notes (Signed)
  CC: contraception counseling Subjective:    Patient ID: Katie Bowers, female    DOB: 1990/07/26, 32 y.o.   MRN: [id number]  HPI Pt seen for discussion of contraception.  Pt currently has progesterone IUD that is 32 years old placed at time of c section.   Pt advised the IUD provdies similar coverage as BTL, but is not permanent. She has another 2-4 years of coverage with the device.  If the device is removed, there Is a good chance her menses will resume.  Pt desires to wait on BTL at this time.   Review of Systems     Objective:   Physical Exam Vitals:   04/05/22 1441  BP: 119/89  Pulse: 79         Assessment & Plan:   1. Screen for STD (sexually transmitted disease) Per pt request - Cervicovaginal ancillary only( [place]) - HIV Antibody (routine testing w rflx) - RPR - Hepatitis B Surface AntiGEN - Hepatitis C Antibody  2. Encounter for counseling regarding contraception Continue IUD for now, Pt can get replacement IUD in 2-4 years or get BTL done at that time.  I spent 10 minutes dedicated to the care of this patient including previsit review of records, face to face time with the patient discussing options and post visit testing.     Griffin Basil, MD Faculty Attending, [facility] for [facility]

## 2022-04-05 NOTE — Progress Notes (Signed)
Patient presents for BTL consult.  Desires STD screen and blood work. Denies symptoms.  Currently has IUD placed, 2021.

## 2022-04-06 LAB — HEPATITIS C ANTIBODY: Hep C Virus Ab: NONREACTIVE

## 2022-04-06 LAB — HIV ANTIBODY (ROUTINE TESTING W REFLEX): HIV Screen 4th Generation wRfx: NONREACTIVE

## 2022-04-06 LAB — RPR: RPR Ser Ql: NONREACTIVE

## 2022-04-06 LAB — HEPATITIS B SURFACE ANTIGEN: Hepatitis B Surface Ag: NEGATIVE

## 2022-04-09 LAB — CERVICOVAGINAL ANCILLARY ONLY
Bacterial Vaginitis (gardnerella): POSITIVE — AB
Candida Glabrata: NEGATIVE
Candida Vaginitis: NEGATIVE
Chlamydia: NEGATIVE
Comment: NEGATIVE
Comment: NEGATIVE
Comment: NEGATIVE
Comment: NEGATIVE
Comment: NEGATIVE
Comment: NORMAL
Neisseria Gonorrhea: NEGATIVE
Trichomonas: NEGATIVE

## 2022-04-10 ENCOUNTER — Other Ambulatory Visit: Payer: Self-pay | Admitting: *Deleted

## 2022-04-10 DIAGNOSIS — B9689 Other specified bacterial agents as the cause of diseases classified elsewhere: Secondary | ICD-10-CM

## 2022-04-10 MED ORDER — METRONIDAZOLE 500 MG PO TABS: 500.0000 mg | ORAL_TABLET | Freq: Two times a day (BID) | ORAL | 0 refills | Status: DC

## 2022-04-10 NOTE — Progress Notes (Signed)
Flagyl RX for BV per protocol.

## 2022-04-10 NOTE — Progress Notes (Signed)
TC. Advised pt of BV. Offered RX Flagyl. RX Flagyl sent per protocol. MyChart education sent.

## 2022-09-10 ENCOUNTER — Ambulatory Visit: Payer: BC Managed Care – PPO | Admitting: Student

## 2022-09-10 ENCOUNTER — Other Ambulatory Visit (HOSPITAL_COMMUNITY)
Admission: RE | Admit: 2022-09-10 | Discharge: 2022-09-10 | Disposition: A | Discharge status: Home / Self Care | Payer: BC Managed Care – PPO | Source: Ambulatory Visit | Attending: Student | Admitting: Student

## 2022-09-10 VITALS — BP 109/76 | HR 77 | Ht 65.0 in | Wt 165.0 lb

## 2022-09-10 DIAGNOSIS — N760 Acute vaginitis: Secondary | ICD-10-CM

## 2022-09-10 DIAGNOSIS — Z975 Presence of (intrauterine) contraceptive device: Secondary | ICD-10-CM | POA: Diagnosis not present

## 2022-09-10 DIAGNOSIS — Z113 Encounter for screening for infections with a predominantly sexual mode of transmission: Secondary | ICD-10-CM | POA: Insufficient documentation

## 2022-09-10 DIAGNOSIS — B9689 Other specified bacterial agents as the cause of diseases classified elsewhere: Secondary | ICD-10-CM

## 2022-09-10 DIAGNOSIS — A5901 Trichomonal vulvovaginitis: Secondary | ICD-10-CM | POA: Insufficient documentation

## 2022-09-10 NOTE — Progress Notes (Signed)
  History:  Ms. Katie Bowers is a 32 y.o. G2P1011 who presents to clinic today for IUD string check and STI screening.  Concerns for BV symptoms x2 last year and x1 this year.   The following portions of the patient's history were reviewed and updated as appropriate: allergies, current medications, family history, past medical history, social history, past surgical history and problem list.  Review of Systems:  Review of Systems  Genitourinary:        Vaginal discharge  All other systems reviewed and are negative.     Objective:  Physical Exam BP 109/76   Pulse 77   Ht 5\' 5"  (1.651 m)   Wt 165 lb (74.8 kg)   BMI 27.46 kg/m  Physical Exam Vitals and nursing note reviewed. Exam conducted with a chaperone present.  Constitutional:      Appearance: Normal appearance.  Cardiovascular:     Rate and Rhythm: Normal rate.  Pulmonary:     Effort: Pulmonary effort is normal.  Genitourinary:    General: Normal vulva.     Exam position: Supine.     Labia:        Right: No rash, tenderness or lesion.        Left: No rash, tenderness or lesion.      Vagina: Lesions present.     Comments: Right internal tender skin erosion/lesion, no papules or blistering, contained to <1cm Musculoskeletal:        General: Normal range of motion.  Skin:    General: Skin is warm and dry.  Neurological:     Mental Status: She is alert and oriented to person, place, and time. Mental status is at baseline.  Psychiatric:        Mood and Affect: Mood normal.        Thought Content: Thought content normal.        Judgment: Judgment normal.      Labs and Imaging No results found for this or any previous visit (from the past 24 hour(s)).  No results found.  Health Maintenance Due  Topic Date Due   HPV VACCINES (3 - 3-dose series) 11/07/2011   COVID-19 Vaccine (3 - 2023-24 season) 10/27/2021    Labs, imaging and previous visits in Epic and Care Everywhere reviewed  Assessment & Plan:  1.  Screen for STD (sexually transmitted disease) - Cervicovaginal ancillary only - Hepatitis C Antibody - Hepatitis B Surface AntiGEN - HIV antibody (with reflex) - RPR  2. IUD (intrauterine device) in place   3. BV (bacterial vaginosis) - Will manage appropriately based on results   Approximately 20 minutes of total time was spent with this patient on direct patient care.   No follow-ups on file.  Corlis Hove, NP 09/10/2022 8:29 AM

## 2022-09-11 LAB — HIV ANTIBODY (ROUTINE TESTING W REFLEX): HIV Screen 4th Generation wRfx: NONREACTIVE

## 2022-09-11 LAB — RPR: RPR Ser Ql: NONREACTIVE

## 2022-09-11 LAB — CERVICOVAGINAL ANCILLARY ONLY
Bacterial Vaginitis (gardnerella): NEGATIVE
Candida Glabrata: NEGATIVE
Candida Vaginitis: NEGATIVE
Chlamydia: NEGATIVE
Comment: NEGATIVE
Comment: NEGATIVE
Comment: NEGATIVE
Comment: NEGATIVE
Comment: NEGATIVE
Comment: NORMAL
Neisseria Gonorrhea: NEGATIVE
Trichomonas: POSITIVE — AB

## 2022-09-11 LAB — HEPATITIS C ANTIBODY: Hep C Virus Ab: NONREACTIVE

## 2022-09-11 LAB — HEPATITIS B SURFACE ANTIGEN: Hepatitis B Surface Ag: NEGATIVE

## 2022-09-12 ENCOUNTER — Other Ambulatory Visit: Payer: Self-pay

## 2022-09-12 DIAGNOSIS — A599 Trichomoniasis, unspecified: Secondary | ICD-10-CM

## 2022-09-12 MED ORDER — METRONIDAZOLE 500 MG PO TABS: 500.0000 mg | ORAL_TABLET | Freq: Two times a day (BID) | ORAL | 0 refills | Status: AC

## 2022-10-08 ENCOUNTER — Ambulatory Visit: Payer: BC Managed Care – PPO

## 2022-10-08 DIAGNOSIS — A599 Trichomoniasis, unspecified: Secondary | ICD-10-CM

## 2022-10-18 ENCOUNTER — Other Ambulatory Visit (HOSPITAL_COMMUNITY): Admission: RE | Admit: 2022-10-18 | Payer: BC Managed Care – PPO | Source: Ambulatory Visit

## 2022-10-18 ENCOUNTER — Ambulatory Visit (INDEPENDENT_AMBULATORY_CARE_PROVIDER_SITE_OTHER): Payer: BC Managed Care – PPO | Admitting: Emergency Medicine

## 2022-10-18 VITALS — BP 113/76 | HR 71 | Ht 65.0 in | Wt 162.9 lb

## 2022-10-18 DIAGNOSIS — Z8619 Personal history of other infectious and parasitic diseases: Secondary | ICD-10-CM | POA: Insufficient documentation

## 2022-10-18 NOTE — Progress Notes (Signed)
SUBJECTIVE:  32 y.o. female presents for TOC for + Trichomonas. Denies abnormal vaginal bleeding or significant pelvic pain or fever. No UTI symptoms. Denies history of known exposure to STD.  No LMP recorded. (Menstrual status: IUD).   OBJECTIVE:  She appears well, afebrile. Urine dipstick: not done.  ASSESSMENT:  Vaginal Discharge  Vaginal Odor   PLAN:  GC, chlamydia, trichomonas, BVAG, CVAG probe sent to lab. Treatment: To be determined once lab results are received ROV prn if symptoms persist or worsen.

## 2022-10-19 LAB — CERVICOVAGINAL ANCILLARY ONLY
Bacterial Vaginitis (gardnerella): NEGATIVE
Candida Glabrata: NEGATIVE
Candida Vaginitis: NEGATIVE
Chlamydia: NEGATIVE
Comment: NEGATIVE
Comment: NEGATIVE
Comment: NEGATIVE
Comment: NEGATIVE
Comment: NEGATIVE
Comment: NORMAL
Neisseria Gonorrhea: NEGATIVE
Trichomonas: NEGATIVE

## 2023-02-18 ENCOUNTER — Emergency Department (HOSPITAL_COMMUNITY): Payer: BC Managed Care – PPO

## 2023-02-18 ENCOUNTER — Encounter (HOSPITAL_COMMUNITY): Payer: Self-pay

## 2023-02-18 ENCOUNTER — Other Ambulatory Visit: Payer: Self-pay

## 2023-02-18 ENCOUNTER — Emergency Department (HOSPITAL_COMMUNITY)
Admission: EM | Admit: 2023-02-18 | Discharge: 2023-02-18 | Disposition: A | Discharge status: Home / Self Care | Payer: BC Managed Care – PPO | Attending: Emergency Medicine | Admitting: Emergency Medicine

## 2023-02-18 DIAGNOSIS — M545 Low back pain, unspecified: Secondary | ICD-10-CM | POA: Diagnosis not present

## 2023-02-18 DIAGNOSIS — Y92410 Unspecified street and highway as the place of occurrence of the external cause: Secondary | ICD-10-CM | POA: Insufficient documentation

## 2023-02-18 LAB — PREGNANCY, URINE: Preg Test, Ur: NEGATIVE

## 2023-02-18 MED ORDER — CYCLOBENZAPRINE HCL 10 MG PO TABS: 10.0000 mg | ORAL_TABLET | Freq: Two times a day (BID) | ORAL | 0 refills | Status: AC | PRN

## 2023-02-18 MED ORDER — ACETAMINOPHEN 500 MG PO TABS
1000.0000 mg | ORAL_TABLET | Freq: Once | ORAL | Status: AC
  Administered 2023-02-18: 1000 mg via ORAL
  Filled 2023-02-18: qty 2
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
## 2023-03-07 ENCOUNTER — Ambulatory Visit: Payer: BC Managed Care – PPO | Admitting: Obstetrics and Gynecology

## 2023-04-12 ENCOUNTER — Ambulatory Visit (HOSPITAL_COMMUNITY)
Admission: EM | Admit: 2023-04-12 | Discharge: 2023-04-12 | Disposition: A | Discharge status: Home / Self Care | Payer: BC Managed Care – PPO

## 2023-04-12 ENCOUNTER — Encounter (HOSPITAL_COMMUNITY): Payer: Self-pay

## 2023-04-12 DIAGNOSIS — R519 Headache, unspecified: Secondary | ICD-10-CM | POA: Diagnosis not present

## 2023-04-12 DIAGNOSIS — R112 Nausea with vomiting, unspecified: Secondary | ICD-10-CM | POA: Diagnosis not present

## 2023-04-12 MED ORDER — DEXAMETHASONE SODIUM PHOSPHATE 10 MG/ML IJ SOLN
INTRAMUSCULAR | Status: AC
  Filled 2023-04-12: qty 1

## 2023-04-12 MED ORDER — KETOROLAC TROMETHAMINE 30 MG/ML IJ SOLN
30.0000 mg | Freq: Once | INTRAMUSCULAR | Status: AC
  Administered 2023-04-12: 30 mg via INTRAMUSCULAR

## 2023-04-12 MED ORDER — ONDANSETRON 4 MG PO TBDP
4.0000 mg | ORAL_TABLET | Freq: Once | ORAL | Status: AC
  Administered 2023-04-12: 4 mg via ORAL

## 2023-04-12 MED ORDER — ONDANSETRON 4 MG PO TBDP
ORAL_TABLET | ORAL | Status: AC
  Filled 2023-04-12: qty 1

## 2023-04-12 MED ORDER — DEXAMETHASONE SODIUM PHOSPHATE 10 MG/ML IJ SOLN
10.0000 mg | Freq: Once | INTRAMUSCULAR | Status: AC
  Administered 2023-04-12: 10 mg via INTRAMUSCULAR

## 2023-04-12 MED ORDER — ONDANSETRON 4 MG PO TBDP: 4.0000 mg | ORAL_TABLET | Freq: Three times a day (TID) | ORAL | 0 refills | Status: AC | PRN

## 2023-04-12 MED ORDER — KETOROLAC TROMETHAMINE 30 MG/ML IJ SOLN
INTRAMUSCULAR | Status: AC
  Filled 2023-04-12: qty 1

## 2023-04-12 NOTE — ED Provider Notes (Signed)
[facility]    CSN: [id number] Arrival date & time: 04/12/23  1609      History   Chief Complaint Chief Complaint  Patient presents with   Nausea   Headache    HPI Katie Bowers is a 33 y.o. female.   Patient presents with constant headache since Sunday.  Patient states that the headache has become progressively worse over the last few days.  Patient endorses mild dizziness, photophobia, nausea, vomiting, and fatigue.  Denies abdominal pain, diarrhea, blurred vision, numbness, weakness, and confusion.   Headache   Past Medical History:  Diagnosis Date   ASCUS (atypical squamous cells of undetermined significance) on Pap smear 12/2009   with high grade HPV   Chlamydia 06/2009, 12/2009   Grave's disease 06/2010   sees Dr. 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    Patient Active Problem List   Diagnosis Date Noted   IUD (intrauterine device) in place 06/18/2019   Indication for care in labor or delivery 06/16/2019   LGA (large for gestational age) fetus affecting management of mother 06/03/2019   Supervision of other normal pregnancy, antepartum 11/26/2018   Hyperthyroidism    H/O herpes genitalis    Pure hypercholesterolemia 02/12/2011   Graves' disease 02/05/2011    Past Surgical History:  Procedure Laterality Date   CESAREAN SECTION N/A 06/16/2019   Procedure: CESAREAN SECTION;  Surgeon: Levie Heritage, DO;  Location: [facility];  Service: Obstetrics;  Laterality: N/A;   NO PAST SURGERIES      OB History     Gravida  2   Para  1   Term  1   Preterm      AB  1   Living  1      SAB      IAB  1   Ectopic      Multiple  0   Live Births  1            Home Medications    Prior to Admission medications   Medication Sig Start Date End Date Taking? Authorizing Provider  levocetirizine (XYZAL) 5 MG tablet Take 1 tablet every day by oral route for 90 days. 06/20/22  Yes  [provider]  ondansetron (ZOFRAN-ODT) 4 MG disintegrating tablet Take 1 tablet (4 mg total) by mouth every 8 (eight) hours as needed for nausea or vomiting. 04/12/23  Yes Wynonia Lawman A, NP  cyclobenzaprine (FLEXERIL) 10 MG tablet Take 1 tablet (10 mg total) by mouth 2 (two) times daily as needed for muscle spasms. 02/18/23   Gunnar Bulla, MD  hydrOXYzine (ATARAX) 10 MG tablet Take 10 mg by mouth 3 (three) times daily as needed for anxiety.   Yes [provider]  metroNIDAZOLE (FLAGYL) 500 MG tablet Take 1 tablet (500 mg total) by mouth 2 (two) times daily. Patient not taking: Reported on 09/10/2022 04/10/22   Warden Fillers, MD  sertraline (ZOLOFT) 20 MG/ML concentrated solution Take by mouth daily.   Yes [provider]    Family History Family History  Problem Relation Age of Onset   Hyperlipidemia Mother    Hypothyroidism Mother    Cancer Maternal Uncle        lung cancer (smoker)   Hyperthyroidism Paternal Aunt    Hypertension Maternal Grandmother    Hypothyroidism Paternal Grandmother     Social History Social History   Tobacco Use   Smoking status: Never  Smokeless tobacco: Never  Vaping Use   Vaping status: Never Used  Substance Use Topics   Alcohol use: Not Currently    Alcohol/week: 0.0 standard drinks of alcohol    Comment: 2 drinks every other month.   Drug use: Not Currently    Types: Marijuana    Comment: last smoked 2013     Allergies   Diphenhydramine, Crab [shellfish allergy], and Methimazole [methimazole]   Review of Systems Review of Systems  Neurological:  Positive for headaches.   Per HPI  Physical Exam Triage Vital Signs ED Triage Vitals  Encounter Vitals Group     BP 04/12/23 1757 117/82     Systolic BP Percentile --      Diastolic BP Percentile --      Pulse Rate 04/12/23 1757 72     Resp 04/12/23 1757 18     Temp 04/12/23 1757 98.7 F (37.1 C)     Temp Source 04/12/23 1757 Oral     SpO2 04/12/23  1757 100 %     Weight --      Height --      Head Circumference --      Peak Flow --      Pain Score 04/12/23 1755 7     Pain Loc --      Pain Education --      Exclude from Growth Chart --    No data found.  Updated Vital Signs BP 117/82 (BP Location: Right Arm)   Pulse 72   Temp 98.7 F (37.1 C) (Oral)   Resp 18   SpO2 100%   Visual Acuity Right Eye Distance:   Left Eye Distance:   Bilateral Distance:    Right Eye Near:   Left Eye Near:    Bilateral Near:     Physical Exam Vitals and nursing note reviewed.  Constitutional:      General: She is awake. She is not in acute distress.    Appearance: Normal appearance. She is well-developed and well-groomed. She is not ill-appearing.  HENT:     Head: Normocephalic.  Eyes:     Extraocular Movements: Extraocular movements intact.     Conjunctiva/sclera: Conjunctivae normal.     Pupils: Pupils are equal, round, and reactive to light.  Abdominal:     General: Abdomen is flat. Bowel sounds are normal.     Palpations: Abdomen is soft.     Tenderness: There is no abdominal tenderness.  Musculoskeletal:        General: Normal range of motion.     Cervical back: Normal range of motion and neck supple.  Skin:    General: Skin is warm and dry.  Neurological:     General: No focal deficit present.     Mental Status: She is alert and oriented to person, place, and time. Mental status is at baseline.     GCS: GCS eye subscore is 4. GCS verbal subscore is 5. GCS motor subscore is 6.     Cranial Nerves: Cranial nerves 2-12 are intact.     Sensory: Sensation is intact.     Motor: Motor function is intact.     Coordination: Coordination is intact.     Gait: Gait is intact.  Psychiatric:        Behavior: Behavior is cooperative.      UC Treatments / Results  Labs (all labs ordered are listed, but only abnormal results are displayed) Labs Reviewed - No data to display  EKG  Radiology No results  found.  Procedures Procedures (including critical care time)  Medications Ordered in UC Medications  ondansetron (ZOFRAN-ODT) disintegrating tablet 4 mg (4 mg Oral Given 04/12/23 1820)  ketorolac (TORADOL) 30 MG/ML injection 30 mg (30 mg Intramuscular Given 04/12/23 1847)  dexamethasone (DECADRON) injection 10 mg (10 mg Intramuscular Given 04/12/23 1847)    Initial Impression / Assessment and Plan / UC Course  I have reviewed the triage vital signs and the nursing notes.  Pertinent labs & imaging results that were available during my care of the patient were reviewed by me and considered in my medical decision making (see chart for details).     Patient presented with persistent headache that began on Sunday.  Patient states that headache has increased in intensity over the last few days.  Endorses mild dizziness, photophobia, nausea, vomiting, and fatigue.  No significant findings upon assessment.  EOMI and PERRLA.  GCS 15.  No neurodeficits noted on exam.  Given Zofran ODT with relief of nausea.  Given Toradol and Decadron injections in clinic for bad headache.  Prescribed Zofran as needed for nausea and vomiting.  Discussed return and strict ER precautions. Final Clinical Impressions(s) / UC Diagnoses   Final diagnoses:  Bad headache  Nausea and vomiting, unspecified vomiting type     Discharge Instructions      You were given an injection of Toradol which is an anti-inflammatory, injection of Decadron which is a steroid to help with inflammation, and Zofran for nausea and vomiting in clinic today.  I have prescribed Zofran as needed for nausea and vomiting that you can take every 8 hours.    Otherwise alternate between Tylenol and ibuprofen for mild headache.  Stay hydrated and get some rest.  If your headache persists, worsens, you develop numbness, weakness, blurred vision, confusion, or slurred speech please seek immediate medical treatment in the ER.    ED Prescriptions      Medication Sig Dispense Auth. Provider   ondansetron (ZOFRAN-ODT) 4 MG disintegrating tablet Take 1 tablet (4 mg total) by mouth every 8 (eight) hours as needed for nausea or vomiting. 10 tablet Wynonia Lawman A, NP      PDMP not reviewed this encounter.   Wynonia Lawman A, NP 04/12/23 1940

## 2023-04-12 NOTE — ED Triage Notes (Signed)
Pt c/o of headaches, dizziness, weakness, nausea, and vomiting.    Start date: 04/07/2023  Home Interventions: Aleve, Ice Pack, Avoiding Light; No relief

## 2023-04-12 NOTE — Discharge Instructions (Addendum)
You were given an injection of Toradol which is an anti-inflammatory, injection of Decadron which is a steroid to help with inflammation, and Zofran for nausea and vomiting in clinic today.  I have prescribed Zofran as needed for nausea and vomiting that you can take every 8 hours.    Otherwise alternate between Tylenol and ibuprofen for mild headache.  Stay hydrated and get some rest.  If your headache persists, worsens, you develop numbness, weakness, blurred vision, confusion, or slurred speech please seek immediate medical treatment in the ER.

## 2023-08-26 ENCOUNTER — Ambulatory Visit: Admitting: Obstetrics

## 2023-08-29 ENCOUNTER — Ambulatory Visit: Admitting: Obstetrics and Gynecology

## 2023-08-29 ENCOUNTER — Encounter: Payer: Self-pay | Admitting: Obstetrics and Gynecology

## 2023-08-29 VITALS — BP 107/68 | HR 76 | Ht 65.0 in | Wt 166.2 lb

## 2023-08-29 DIAGNOSIS — Z30432 Encounter for removal of intrauterine contraceptive device: Secondary | ICD-10-CM

## 2023-08-29 DIAGNOSIS — Z3040 Encounter for surveillance of contraceptives, unspecified: Secondary | ICD-10-CM

## 2023-08-29 NOTE — Progress Notes (Signed)
 Here for IUD removal. Pt reports pain during intercourse.  Pt also reports desire to conceive.   Pt not having periods.   No other concerns at this time.

## 2023-08-29 NOTE — Progress Notes (Signed)
   IUD Removal Procedure Note   Patient is 33 y.o. G2P1011 who is here for Liletta  IUD removal. She would like IUD removed secondary to wanting to conceive. She has had dyspareunia with IUD. She understands that she could get pregnant after removal of IUD if she does not use another form of contraception. She has no other complaints today. Reviewed risks of removal including pain, bleeding, difficult removal and inability to remove IUD which may require surgical removal in OR. She affirms that she would like IUD removed.  BP 107/68   Pulse 76   Ht 5' 5 (1.651 m)   Wt 166 lb 3.2 oz (75.4 kg)   LMP  (LMP Unknown)   BMI 27.66 kg/m   Patient with normal appearing external female genitalia. Graves speculum placed in vagina and IUD strings easily visualized. Strings grasped with ring forceps and removed easily. Minimal bleeding noted. All instruments removed from vagina. Patient tolerated procedure very well.    She was given post removal instructions. She is planning on using no birth control method for contraception. She desires to conceive.  Return in 1-4 weeks for annual with pap, prn or with (+) HPT.  Geovannie Vilar, MSN, CNM 08/29/2023 12:34 PM

## 2023-09-26 ENCOUNTER — Encounter: Payer: Self-pay | Admitting: Physician Assistant

## 2023-09-26 ENCOUNTER — Other Ambulatory Visit (HOSPITAL_COMMUNITY)
Admission: RE | Admit: 2023-09-26 | Discharge: 2023-09-26 | Disposition: A | Discharge status: Home / Self Care | Source: Ambulatory Visit | Attending: Physician Assistant | Admitting: Physician Assistant

## 2023-09-26 ENCOUNTER — Ambulatory Visit: Admitting: Physician Assistant

## 2023-09-26 VITALS — BP 109/75 | HR 77 | Ht 65.0 in | Wt 167.0 lb

## 2023-09-26 DIAGNOSIS — Z124 Encounter for screening for malignant neoplasm of cervix: Secondary | ICD-10-CM | POA: Diagnosis not present

## 2023-09-26 DIAGNOSIS — Z8619 Personal history of other infectious and parasitic diseases: Secondary | ICD-10-CM

## 2023-09-26 DIAGNOSIS — Z113 Encounter for screening for infections with a predominantly sexual mode of transmission: Secondary | ICD-10-CM | POA: Insufficient documentation

## 2023-09-26 DIAGNOSIS — Z01419 Encounter for gynecological examination (general) (routine) without abnormal findings: Secondary | ICD-10-CM

## 2023-09-26 DIAGNOSIS — Z1151 Encounter for screening for human papillomavirus (HPV): Secondary | ICD-10-CM | POA: Insufficient documentation

## 2023-09-26 NOTE — Progress Notes (Signed)
 Pt presents for annual. Pt would like pap and swab std testing only. No other questions or concerns at this time.

## 2023-09-26 NOTE — Progress Notes (Signed)
   ANNUAL EXAM Patient name: Katie Bowers MRN [id number]  Date of birth: 12/13/1990 Chief Complaint:   Annual Exam  History of Present Illness:   Katie Bowers is a 33 y.o. G28P1011 female being seen today for a routine annual exam.   Current complaints: None  No LMP recorded (lmp unknown). (Menstrual status: IUD).  The pregnancy intention screening data noted above was reviewed. Potential methods of contraception were discussed. The patient elected to proceed with No data recorded.   Last pap 07/01/20 NILM HPV neg.  H/O abnormal pap: no Last mammogram: Never previously done due to age. Family h/o breast cancer: no Last colonoscopy: Never previously done due to age. Family h/o colorectal cancer: no STI screening: Accepts Contraception: None      No data to display               No data to display           Review of Systems:   Pertinent items are noted in HPI Denies any headaches, blurred vision, fatigue, shortness of breath, chest pain, abdominal pain, abnormal vaginal discharge/itching/odor/irritation, problems with periods, bowel movements, urination, or intercourse unless otherwise stated above. Pertinent History Reviewed:  Reviewed past medical,surgical, social and family history.  Reviewed problem list, medications and allergies. Physical Assessment:   Vitals:   09/26/23 0826  BP: 109/75  Pulse: 77  Weight: 167 lb (75.8 kg)  Height: 5' 5 (1.651 m)  Body mass index is 27.79 kg/m.        Physical Examination:   General appearance - well appearing, and in no distress  Mental status - alert, oriented to person, place, and time  Psych:  She has a normal mood and affect  Skin - warm and dry, normal color, no suspicious lesions noted  Chest - effort normal, all lung fields clear to auscultation bilaterally  Heart - normal rate and regular rhythm  Neck:  midline trachea, no thyromegaly or nodules  Breasts - breasts appear normal, no suspicious masses, no skin  or nipple changes or  axillary nodes  Abdomen - soft, nontender, nondistended, no masses or organomegaly  Pelvic - VULVA: normal appearing vulva with no masses, tenderness or lesions  VAGINA: normal appearing vagina with normal color and discharge, no lesions  CERVIX: normal appearing cervix without discharge or lesions, no CMT  Thin prep pap is done with HR HPV cotesting  UTERUS: uterus is felt to be normal size, shape, consistency and nontender   ADNEXA: No adnexal masses or tenderness noted.  Extremities:  No swelling or varicosities noted  Chaperone present for exam  No results found for this or any previous visit (from the past 24 hours).  Assessment & Plan:  1. Encounter for annual routine gynecological examination (Primary) 2. Cervical cancer screening  - Cervical cancer screening: Discussed guidelines. Pap with HPV updated today at patient's request - GC/CT: accepts - Birth Control: Does not desire at this time - Breast Health: Encouraged self breast awareness/SBE. Teaching provided.  - Mammogram: @ 33yo, or sooner if problems - Colonoscopy: @ 33yo, or sooner if problems - F/U 12 months and prn   3. H/O herpes genitalis Last out break >4 years ago  On no prohylaxis   No orders of the defined types were placed in this encounter.   Meds: No orders of the defined types were placed in this encounter.   Follow-up: No follow-ups on file.  Layliana Devins E Marlos Carmen, PA-C 09/26/2023 9:00 AM

## 2023-09-27 LAB — CERVICOVAGINAL ANCILLARY ONLY
Bacterial Vaginitis (gardnerella): POSITIVE — AB
Candida Glabrata: NEGATIVE
Candida Vaginitis: NEGATIVE
Chlamydia: NEGATIVE
Comment: NEGATIVE
Comment: NEGATIVE
Comment: NEGATIVE
Comment: NEGATIVE
Comment: NEGATIVE
Comment: NORMAL
Neisseria Gonorrhea: NEGATIVE
Trichomonas: NEGATIVE

## 2023-09-30 ENCOUNTER — Ambulatory Visit: Payer: Self-pay | Admitting: Physician Assistant

## 2023-10-01 ENCOUNTER — Other Ambulatory Visit: Payer: Self-pay

## 2023-10-01 DIAGNOSIS — B9689 Other specified bacterial agents as the cause of diseases classified elsewhere: Secondary | ICD-10-CM

## 2023-10-01 LAB — CYTOLOGY - PAP
Comment: NEGATIVE
Diagnosis: NEGATIVE
High risk HPV: NEGATIVE

## 2023-10-01 MED ORDER — METRONIDAZOLE 500 MG PO TABS: 500.0000 mg | ORAL_TABLET | Freq: Two times a day (BID) | ORAL | 0 refills | Status: AC

## 2024-03-12 ENCOUNTER — Telehealth: Payer: Self-pay
